# Patient Record
Sex: Female | Born: 1970 | Race: White | Hispanic: No | Marital: Married | Smoking: Current every day smoker
Health system: Southern US, Community
[De-identification: ages and names within clinical notes are randomized; demographics above are authoritative.]

## PROBLEM LIST (undated history)

## (undated) DIAGNOSIS — I10 Essential (primary) hypertension: Secondary | ICD-10-CM

## (undated) DIAGNOSIS — K5090 Crohn's disease, unspecified, without complications: Secondary | ICD-10-CM

## (undated) DIAGNOSIS — K589 Irritable bowel syndrome without diarrhea: Secondary | ICD-10-CM

## (undated) HISTORY — PX: CHOLECYSTECTOMY: SHX55

## (undated) HISTORY — PX: OTHER SURGICAL HISTORY: SHX169

---

## 1998-05-06 ENCOUNTER — Other Ambulatory Visit: Admission: RE | Admit: 1998-05-06 | Discharge: 1998-05-06 | Payer: Self-pay | Admitting: Pediatrics

## 1998-07-15 ENCOUNTER — Encounter: Payer: Self-pay | Admitting: Otolaryngology

## 1998-07-15 ENCOUNTER — Ambulatory Visit (HOSPITAL_COMMUNITY): Admission: RE | Admit: 1998-07-15 | Discharge: 1998-07-15 | Payer: Self-pay | Admitting: Otolaryngology

## 1998-08-27 ENCOUNTER — Other Ambulatory Visit: Admission: RE | Admit: 1998-08-27 | Discharge: 1998-08-27 | Payer: Self-pay | Admitting: Otolaryngology

## 1999-05-25 ENCOUNTER — Other Ambulatory Visit: Admission: RE | Admit: 1999-05-25 | Discharge: 1999-05-25 | Payer: Self-pay | Admitting: Obstetrics and Gynecology

## 2000-06-13 ENCOUNTER — Other Ambulatory Visit: Admission: RE | Admit: 2000-06-13 | Discharge: 2000-06-13 | Payer: Self-pay | Admitting: Obstetrics and Gynecology

## 2001-07-03 ENCOUNTER — Other Ambulatory Visit: Admission: RE | Admit: 2001-07-03 | Discharge: 2001-07-03 | Payer: Self-pay | Admitting: Obstetrics and Gynecology

## 2002-03-27 ENCOUNTER — Encounter: Payer: Self-pay | Admitting: Emergency Medicine

## 2002-03-27 ENCOUNTER — Emergency Department (HOSPITAL_COMMUNITY): Admission: EM | Admit: 2002-03-27 | Discharge: 2002-03-27 | Payer: Self-pay | Admitting: Emergency Medicine

## 2002-10-21 ENCOUNTER — Emergency Department (HOSPITAL_COMMUNITY): Admission: EM | Admit: 2002-10-21 | Discharge: 2002-10-21 | Payer: Self-pay | Admitting: Emergency Medicine

## 2002-10-24 ENCOUNTER — Other Ambulatory Visit: Admission: RE | Admit: 2002-10-24 | Discharge: 2002-10-24 | Payer: Self-pay | Admitting: Obstetrics and Gynecology

## 2003-11-04 ENCOUNTER — Other Ambulatory Visit: Admission: RE | Admit: 2003-11-04 | Discharge: 2003-11-04 | Payer: Self-pay | Admitting: Obstetrics and Gynecology

## 2005-11-20 ENCOUNTER — Emergency Department (HOSPITAL_COMMUNITY): Admission: EM | Admit: 2005-11-20 | Discharge: 2005-11-20 | Payer: Self-pay | Admitting: Emergency Medicine

## 2006-01-26 ENCOUNTER — Ambulatory Visit: Payer: Self-pay | Admitting: Gastroenterology

## 2006-02-11 ENCOUNTER — Ambulatory Visit: Payer: Self-pay | Admitting: Gastroenterology

## 2006-02-17 ENCOUNTER — Ambulatory Visit (HOSPITAL_COMMUNITY): Admission: RE | Admit: 2006-02-17 | Discharge: 2006-02-17 | Payer: Self-pay | Admitting: Gastroenterology

## 2006-02-18 ENCOUNTER — Inpatient Hospital Stay (HOSPITAL_COMMUNITY): Admission: EM | Admit: 2006-02-18 | Discharge: 2006-02-26 | Payer: Self-pay | Admitting: Emergency Medicine

## 2006-03-02 ENCOUNTER — Ambulatory Visit: Payer: Self-pay | Admitting: Internal Medicine

## 2006-03-09 ENCOUNTER — Ambulatory Visit (HOSPITAL_COMMUNITY): Admission: RE | Admit: 2006-03-09 | Discharge: 2006-03-09 | Payer: Self-pay | Admitting: Gastroenterology

## 2006-03-23 ENCOUNTER — Ambulatory Visit: Payer: Self-pay | Admitting: Gastroenterology

## 2006-04-26 ENCOUNTER — Ambulatory Visit: Payer: Self-pay | Admitting: Gastroenterology

## 2006-04-26 LAB — CONVERTED CEMR LAB
ALT: 19 units/L (ref 0–40)
Albumin: 3.3 g/dL — ABNORMAL LOW (ref 3.5–5.2)
Alkaline Phosphatase: 70 units/L (ref 39–117)
Basophils Relative: 0.2 % (ref 0.0–1.0)
MCHC: 34 g/dL (ref 30.0–36.0)
Monocytes Relative: 4.1 % (ref 3.0–11.0)
Platelets: 624 10*3/uL — ABNORMAL HIGH (ref 150–400)
RBC: 4.26 M/uL (ref 3.87–5.11)
RDW: 16.1 % — ABNORMAL HIGH (ref 11.5–14.6)
Total Bilirubin: 0.5 mg/dL (ref 0.3–1.2)
Total Protein: 7.1 g/dL (ref 6.0–8.3)

## 2006-07-05 ENCOUNTER — Inpatient Hospital Stay (HOSPITAL_COMMUNITY): Admission: EM | Admit: 2006-07-05 | Discharge: 2006-07-09 | Payer: Self-pay | Admitting: Emergency Medicine

## 2006-09-09 ENCOUNTER — Encounter: Admission: RE | Admit: 2006-09-09 | Discharge: 2006-09-09 | Payer: Self-pay | Admitting: Gastroenterology

## 2007-10-02 ENCOUNTER — Encounter: Admission: RE | Admit: 2007-10-02 | Discharge: 2007-10-02 | Payer: Self-pay | Admitting: Obstetrics and Gynecology

## 2008-05-05 ENCOUNTER — Emergency Department (HOSPITAL_COMMUNITY): Admission: EM | Admit: 2008-05-05 | Discharge: 2008-05-05 | Payer: Self-pay | Admitting: Emergency Medicine

## 2008-06-16 ENCOUNTER — Emergency Department (HOSPITAL_COMMUNITY): Admission: EM | Admit: 2008-06-16 | Discharge: 2008-06-16 | Payer: Self-pay | Admitting: Emergency Medicine

## 2009-04-02 ENCOUNTER — Encounter: Admission: RE | Admit: 2009-04-02 | Discharge: 2009-04-02 | Payer: Self-pay | Admitting: Gastroenterology

## 2009-05-23 ENCOUNTER — Other Ambulatory Visit: Admission: RE | Admit: 2009-05-23 | Discharge: 2009-05-23 | Payer: Self-pay | Admitting: Obstetrics and Gynecology

## 2010-04-26 ENCOUNTER — Encounter: Payer: Self-pay | Admitting: Gastroenterology

## 2010-07-20 LAB — CBC
MCHC: 33.7 g/dL (ref 30.0–36.0)
MCV: 91.7 fL (ref 78.0–100.0)
RBC: 4.96 MIL/uL (ref 3.87–5.11)
RDW: 13.5 % (ref 11.5–15.5)

## 2010-07-20 LAB — COMPREHENSIVE METABOLIC PANEL
ALT: 34 U/L (ref 0–35)
AST: 21 U/L (ref 0–37)
CO2: 29 mEq/L (ref 19–32)
Calcium: 9.3 mg/dL (ref 8.4–10.5)
Creatinine, Ser: 0.75 mg/dL (ref 0.4–1.2)
GFR calc Af Amer: >60 mL/min (ref >60)
GFR calc non Af Amer: >60 mL/min (ref >60)
Glucose, Bld: 108 mg/dL — ABNORMAL HIGH (ref 70–99)
Sodium: 140 mEq/L (ref 135–145)
Total Protein: 7 g/dL (ref 6.0–8.3)

## 2010-07-20 LAB — DIFFERENTIAL
Lymphocytes Relative: 24 % (ref 12–46)
Lymphs Abs: 2.8 10*3/uL (ref 0.7–4.0)
Monocytes Relative: 7 % (ref 3–12)
Neutrophils Relative %: 68 % (ref 43–77)

## 2010-07-20 LAB — POCT CARDIAC MARKERS
CKMB, poc: <1 ng/mL — ABNORMAL LOW (ref 1.0–8.0)
Troponin i, poc: <0.05 ng/mL (ref 0.00–0.09)

## 2010-07-20 LAB — LIPASE, BLOOD: Lipase: 29 U/L (ref 11–59)

## 2010-08-21 NOTE — Assessment & Plan Note (Signed)
[facility]                         GASTROENTEROLOGY OFFICE NOTE   NAME:JONESJeronica, Stlouis                  MRN:          [id number]  DATE:03/23/2006                            DOB:          05-31-1970    PROBLEM:  Crohn's disease.   REASON:  Ms. Jean Rosenthal is returning following her hospitalization for  small bowel obstruction.  She underwent capsule endoscopy and the  capsule became lodged in her distal ileum.  This resolved with medical  therapy including high dose steroids.  Capsule and small bowel follow  through demonstrated Crohn's disease involving the terminal ileum.  She  has been on prednisone 20 mg twice a day since that time.  She currently  feels well, having no diarrhea or abdominal pain.  Her main complaints  are related to her prednisone characterized by emotional lability.   MEDICATIONS:  Include:  1. Ranitidine.  2. Prednisone.  3. Levbid.  4. Pyridium.   EXAMINATION:  Pulse 80, blood pressure 110/70.  Weight 186.  HEENT: EOMI. PERRLA. Sclerae are anicteric.  Conjunctivae are pink.  NECK:  Supple without thyromegaly, adenopathy or carotid bruits.  CHEST:  Clear to auscultation and percussion without adventitious  sounds.  CARDIAC:  Regular rhythm; normal S1 S2.  There are no murmurs, gallops  or rubs.  ABDOMEN:  Bowel sounds are normoactive.  Abdomen is soft, non-tender and  non-distended.  There are no abdominal masses, tenderness, splenic  enlargement or hepatomegaly.  EXTREMITIES:  Full range of motion.  No cyanosis, clubbing or edema.  RECTAL:  Deferred.   IMPRESSION:  Crohn's disease involving the small bowel.   RECOMMENDATION:  1. Begin Imuran 50 mg daily.  I will check her phenotypic markers to      determine whether she metabolizes at a normal rate.  2. Prednisone taper by every 7 days.  3. Continue Xanax as needed.     Barbette Hair. Arlyce Dice, MD,FACG  Electronically Signed   RDK/MedQ  DD: 03/23/2006  DT:  03/23/2006  Job #: [id number]   cc:   Tammy R. Collins [place], M.D.

## 2010-08-21 NOTE — Assessment & Plan Note (Signed)
[facility]                                   ON-CALL NOTE   NAME:JONESShaneque, Lori [name]                          MRN:          [id number]  DATE:02/16/2006                            DOB:          [birth year]    TELEPHONE MESSAGE:  Lori [name] is a 40 year old patient of Dr. Arlyce Dice who is scheduled for  capsule endoscopy for tomorrow after having colonoscopy last week.  She was  prescribed to take a bottle of magnesium citrate 8 ounces, but has been  unable to drink it.  Her husband calls reporting that the patient has  vomited her magnesium citrate.  He asked for an alternative laxative.  This  is now around 7 o'clock in the evening, and I am calling to [facility]  MiraLax 255 gm in 64 ounces of Gatorade to mix and take one glass every 10  or 15 minutes until gone.     Hedwig Morton. Juanda Chance, MD  Electronically Signed    DMB/MedQ  DD: 02/16/2006  DT: 02/16/2006  Job #: [id number]   cc:   Barbette Hair. Arlyce Dice, MD,FACG

## 2010-08-21 NOTE — H&P (Signed)
Lori [name], Lori [name] NO.:  [id number]   MEDICAL RECORD NO.:  [id number]          PATIENT TYPE:  OBV   LOCATION:  5735                         FACILITY:  [facility]   PHYSICIAN:  Iva Boop, MD,FACGDATE OF BIRTH:  1970/06/06   DATE OF ADMISSION:  02/17/2006  DATE OF DISCHARGE:                                HISTORY & PHYSICAL   GI PHYSICIAN:  Dr. Arlyce Dice.   CHIEF COMPLAINT:  Abdominal pain following capsule endoscopy.   HISTORY OF PRESENT ILLNESS:  This is a 40 year old white female with a long  history, which she describes as at least 13 years, of diarrhea and abdominal  cramps, gastrointestinal distress, that has been diagnosed as IBS.  She  never sees blood.  She had had weight loss of up to 60 pounds 3 or 4 years  ago when she was separating from her first husband.  She is now remarried.  She denies weight fluctuations of more than 5-10 pounds within the last 18  months.  The patient underwent a CT scan of the abdomen at [facility]  [facility] showing thickening of the terminal ileum and cecal wall with  appendiceal thickening as well.  She was evaluated in the office by Dr.  Arlyce Dice on [place] and underwent colonoscopy by him on November9.  The colonoscopy was within normal limits, although he was unable to  visualize the terminal ileum.  She was set up for a capsule endoscopy and  this was performed on November 16th, which is today.  She did okay until  about 3 o'clock.  She brought the monitor back and then had some increased  symptoms of her typical pain, which tends to be bilateral in the mid lower  abdomen.  The pain became more intense and she developed shaking all over  then an episode of nausea and vomiting.  She went to the ED and Dr. Leone Payor  evaluated her.  The acute abdominal series shows some mild dilation of bowel  loops, some air fluid levels, and the capsule in the right lower quadrant.  Her last bowel movement was at 3:00 p.m.  There was some  relief after  administration of Dilaudid but the symptoms returned.  Note that the patient  required 125 mg of fentanyl and 14 mg of Versed for her colonoscopy.  She  did have amnesia but awakened readily and went out to eat after the  colonoscopy.   The patient also had incidental findings of renal, what she describes as  kidney cysts on the CT scan performed in October and she has been referred  on to a specialist for evaluation of this.  The appointment has not yet  happened.  She has a history of kidney stones but the current symptoms are  not reminiscent of kidney stone pain.  Generally, the patient's appetite is  excellent.   ALLERGIES:  No known drug allergies.   MEDICATIONS:  1. Ranitidine dose unknown p.r.n.  2. Benadryl 25 mg p.r.n. for sinus congestion and to help with sleep.  3. Vicodin 5/500 p.r.n. She does not use a lot of  this. This is for lower      back pain which she was having a few weeks back.  She tried using this      recently for today's abdominal pain but it has not been working.   SOCIAL HISTORY:  She is married twice.  She smokes about a pack a day.  Has  not drank alcohol recently but will occasionally have small amounts of  alcohol.  Does not use illicit drugs.   FAMILY HISTORY:  No GI diseases.   PAST MEDICAL HISTORY:  1. Gastroesophageal reflux disease.  2. Seasonal and environmental allergies.  3. Nephrolithiasis.  4. Renal cysts.  5. Chronic diarrhea and chronic abdominal intestinal distress diagnosed as      IBS in the past.   REVIEW OF SYSTEMS:  The patient has not had any fevers or chills but she did  have the shaking all over earlier today.  She denies urinary retention and  denies dysuria.  Previous to today, has not had any nausea or vomiting.  Recently had low back pain.  Denies rash or skin lesions.  Denies limb  weakness.  Denies headaches.  No swelling of the extremities.  No visual  changes.  No unusual bleeding or bruising.  Has  occasional trouble with  insomnia.  Is up to date with flu vaccine this year.  Has not undergone  pneumococcal vaccine in the past.  Denies shortness of breath, chest pain,  or dyspnea on exertion.  She is not on any kind of specific diet and  generally eats well.  All other systems reviewed and negative.   LABORATORY:  Hemoglobin 14.6, hematocrit 43.  Platelets 448,000.  White  blood cell count 5.6.  The MCV 84.1.  Sodium 139, potassium 3.6.  Chloride  108.  The BUN 15, creatinine 1.  Serum blood glucose at 130.  Urinalysis is  negative.  The LFTs within normal limits.   X-RAY FINDINGS:  As described above on acute abdominal series.   PHYSICAL EXAM:  The patient is an overweight, healthy-appearing white female  who is in some distress and uncomfortable.  Blood pressure 104/69, 100,  respirations 18, temperature 96.9.  Room air saturation is 97%.  HEENT EXAM:  Sclerae nonicteric. Conjunctivae pink.  Extraocular movements  intact.  Oropharynx - mucosa is moist and clear.  Dentition in good repair.  NECK:  There is no JVD, no thyromegaly, no masses.  CHEST/PULMONARY:  Lungs are clear to auscultation and percussion  bilaterally.  No cough, no shortness of breath, no dyspnea with speech.  CARDIOVASCULAR:  No murmurs, rubs or gallops. Rhythm is regular and slightly  tachycardic.  GI: The abdomen is soft, obese.  There is tenderness in the lower quadrants  bilaterally.  No rebound, no guarding.  Bowel sounds are active.  RECTAL/GU/BREAST EXAMS:  Were not performed.  PSYCHIATRIC EXAM:  The patient slightly anxious.  She is appropriate.  There  is no tremor.  Grip strength, pedal strength are full bilaterally.  She is  able to provide a good history.  DERMATOLOGIC:  No rashes, no lesions.  HEMATOLOGIC:  No significant bruising on the extremities or trunk.  MUSCULOSKELETAL:  No CVA tenderness.  No joint deformities.   IMPRESSION: 1. Abdominal pain following capsule endoscopy with x-ray  showing mildly      dilated loops of bowel and some air fluid levels.  Question whether her      symptoms are exacerbation of underlying condition which may be  irritable bowel syndrome versus inflammatory bowel disease.  Symptoms      also may be related to capsule endoscopy and we will need to follow up      with serial films in order to ascertain whether the capsule has become      obstructive in the bowel.  2. Renal cysts and history of nephrolithiasis with urinalysis negative.   PLAN:  The patient is placed in observation for supportive care including  analgesics, antiemetics, and will also undergo followup x-rays to determine  bowel gas pattern and location of capsule.      Jennye Moccasin, PA-C      Iva Boop, MD,FACG  Electronically Signed    SG/MEDQ  D:  02/18/2006  T:  02/18/2006  Job:  [id number]

## 2010-08-21 NOTE — Consult Note (Signed)
NAMEMarland Kitchen  Lori, NICOL                 ACCOUNT NO.:  [id number]   MEDICAL RECORD NO.:  [id number]          PATIENT TYPE:  INP   LOCATION:  5736                         FACILITY:  [facility]   PHYSICIAN:  Dineen Kid. Rana Snare, M.D.    DATE OF BIRTH:  Apr 20, 1970   DATE OF CONSULTATION:  07/07/2006  DATE OF DISCHARGE:                                 CONSULTATION   HISTORY OF PRESENT ILLNESS:  Lori [name] is a 40 year old white female,  well-known to me in my office and practice, who was admitted for Crohn's  ileitis.  After hospitalization, she had undergone a CAT scan as well as  pelvic ultrasound, which shows a right adnexal complex cyst that  measures 7.2 x 6.1 x 5.1 cm.  Also a left ovarian cyst that measured 3.3  x 3.2 x 2.7 cm.  They feel that it most likely represents a complex  cyst, less likely TOA or hydrosalpinx.  The patient has had a low-grade  temperature most recently this afternoon at 100.1.  She had an elevated  white count, which actually today has decreased from 27,000 down to  22,000.  She complains mostly of abdominal discomfort with right lower  quadrant discomfort, as well, and is being treated with steroids and  antibiotics at this time.  Lori [name] has seen me in the office on May 30, 2006, at which time we did do a pelvic ultrasound because of a  history of an ovarian cyst as seen by CAT scan by Dr. Isabel [name] several  months ago, at which time, on May 30, 2006, the ultrasound showed a  normal-appearing right ovary, normal-appearing uterus.  She did have a  2.8-cm cyst on the left ovary which appeared to be benign.  The patient  is monogamous, is married, has had regular cycles and is currently on  her menstrual cycle at this time, but is not using any form of  contraception at this time, because she does not have any childbearing  desires and has been unable to use birth control pills due to the fact  that she is a smoker.  She does have a history of a left ureteral stone  and is followed by Dr. Isabel [name] for this.   On physical exam today, Lori [name] appears to be in good spirits, not in  acute distress.  Her abdomen is soft, mildly to deep palpation in the  right lower quadrant and really more towards the right side out of the  pelvis.  No rebound or guarding is noted.  The patient does decline  pelvic exam due to recently having had a pelvic ultrasound and also  currently being on her menses.   IMPRESSION AND PLAN:  Crohn's ileitis; right adnexal complex cyst on  ultrasound, which is new-it was not there 1 month ago on ultrasound in  my office, and left simple-appearing ovarian cyst.  The patient's  discomfort and symptoms appear to be more consistent with the Crohn's  ileitis, while I do not exclude the possibility that this could be  related to right ovarian cystic mass.  This could be  inflammatory, due  to the Crohn's ileitis, I doubt a tubo-ovarian abscess.  I discussed  earlier with Dr. Randa [name] the possibility of going ahead and placing her  empirically on Cipro and Flagyl, which he has begun.  She is currently  on a steroid for the ileitis.  I discussed different options with Lori [name].  She agrees that she thinks that this is most likely the Crohn's disease.  I discussed the possibility of laparoscopic evaluation, but I would  prefer to manage this medically, if at all possible.  She does not  appear to be acutely ill or have anything suspicious for ovarian  torsion.  I did recommend ovulation suppression for long-term management  of a recurrent ovarian cyst and also her menorrhagia and dysmenorrhea.  Because she is a smoker, we cannot use birth control pills, so I did go  ahead and start her on Depo-Provera 150 mg IM every 3 months, which we  can go ahead and start today.  If we are able to get her through this  acute episode in the hospital, we will have her follow with my  office in 4-6 weeks for pelvic ultrasound and followup.  If the pain  does not  improve, or certainly, if we feel like it is more likely a tubo-  ovarian abscess, please contact me for further evaluation and I could  offer laparoscopic evaluation, but I feel like it is not necessary at  this time.      Dineen Kid Rana Snare, M.D.  Electronically Signed     DCL/MEDQ  D:  07/07/2006  T:  07/08/2006  Job:  [id number]

## 2010-08-21 NOTE — Letter (Signed)
January 26, 2006    Lori [name]  [address]  [place], [place]  [address]   RE:  Lori [name], Lori [name]  MRN:  [id number]  /  DOB:  [birth year]   Dear Ms. Jean Rosenthal:   It is my pleasure to have treated you recently as a new patient in my  office.  I appreciate your confidence and the opportunity to participate in  your care.   Since I do have a busy inpatient endoscopy schedule and office schedule, my  office hours vary weekly.  I am, however, available for emergency calls  every day through my office.  If I cannot promptly meet an urgent office  appointment, another one of our gastroenterologists will be able to assist  you.   My well-trained staff are prepared to help you at all times.  For  emergencies after office hours, a physician from our gastroenterology  section is always available through my 24-hour answering service.   While you are under my care, I encourage discussion of your questions and  concerns, and I will be happy to return your calls as soon as I am  available.   Once again, I welcome you as a new patient and I look forward to a happy and  healthy relationship.   Sincerely,     Barbette Hair. Arlyce Dice, MD,FACG    RDK/MedQ  DD: 01/26/2006  DT: 01/27/2006  Job #: [id number]

## 2010-08-21 NOTE — H&P (Signed)
NAMEMarland [name]  Lori, [name] NO.:  [id number]   MEDICAL RECORD NO.:  [id number]          PATIENT TYPE:  INP   LOCATION:  5733                         FACILITY:  [facility]   PHYSICIAN:  Bernette Redbird, M.D.   DATE OF BIRTH:  1971/01/16   DATE OF ADMISSION:  07/05/2006  DATE OF DISCHARGE:                              HISTORY & PHYSICAL   This 40 year old female with Crohn's disease is being admitted to the  hospital because of significant abdominal pain.   Lori [name] was diagnosed with Crohn's ileitis last November by Dr. Barnet Pall  who had previously treated her, but she switched from him to Dr. Carman Ching after a problem developed during a small bowel capsule endoscopy  procedure, wherein the pill became impacted in the intestine, and she  had to be admitted to the hospital for about 8 days on IV steroids  before it finally passed.  Dr. Randa Evens has followed her for the past 2  months or so and was in the process of obtaining an updated small bowel  series when the patient began to have increased abdominal pain over the  past 24 hours, primarily in the low abdomen, not associated with any  significant nausea or vomiting or fevers.  The pain was somewhat  bloating in character.  It seemed to occur after eating a sub sandwich.  She tried some Vicodin at home which may have relieved the pain  partially but really could not control it adequately, so she called back  and we felt it was best for her to come to the emergency room.  She has  had some slight nausea after taking Vicodin and has had temperatures at  home as high as 99.9 degrees but no frank high fevers.  In view of all  this, she was directed to the emergency room where she was noted to be  in moderate discomfort and inpatient care of her symptom control and  diagnostic clarification was felt to be warranted.   PAST MEDICAL HISTORY:   ALLERGIES:  SULFA DRUGS (nausea).   OUTPATIENT MEDICATIONS:  1. Imuran 150  q.a.m.  2. Levbid 1 tablet b.i.d.  3. Xanax p.r.n.  4. Vicodin p.r.n.  5. Pyridium 200 mg b.i.d.  6. Ranitidine at bedtime.  7. AcipHex in the morning.  8. Pentasa 500 mg q.i.d.  (The patient has been on a steroid taper and has been off steroids  entirely for approximately the past 2 months.)   OPERATIONS:  1. Cholecystectomy.  2. Removal of pilonidal cyst.  3. Tonsillectomy.  4. Ear surgery.   MEDICAL ILLNESSES:  1. Chronic reflux which is fairly well controlled by her anti-reflux      regimen with a PPI in the morning and ranitidine in the evening.  2. Crohn's disease.  3. Dysuria followed by Dr. Elana Alm.  (No cardiopulmonary disease, diabetes or hypertension.)   HABITS:  She does smoke but is a nondrinker.   FAMILY HISTORY:  Negative for Crohn's disease.  Positive for colon  polyps in her father and IBS in a sister.   SOCIAL  HISTORY:  Married with two children by a previous marriage,  recently remarried.  She has worked as a Architectural technologist.   REVIEW OF SYSTEMS:  GERD symptoms, well controlled by medication,  chronic abdominal discomfort, even prior to the current flare up.  No  dysphagia to my knowledge.  No weight loss (on the contrary, gained a  lot of weight while on steroids).  No rectal bleeding.  No  extraintestinal manifestations of Crohn's such as oral ulcerations, skin  rashes, arthralgias or ocular complaints.   PHYSICAL EXAMINATION:  GENERAL:  Natali is a somewhat overweight,  somewhat cushingoid-appearing, pleasant, cooperative Caucasian female  who is intermittently in moderate distress due to abdominal pain.  VITAL SIGNS:  Temperature 99.4, blood pressure 114/77, pulse 109,  respirations 26 and unlabored.  HEENT:  Oropharynx benign without oral ulcers appreciated.  Anicteric.  No frank pallor.  CHEST:  Clear to auscultation.  HEART:  Normal except for slightly rapid rate.  ABDOMEN:  Obese, but overall benign.  Bowel sounds are present and   really are normal in character, nonobstructive.  No bruits.  No  significant tympany.  The abdomen is soft and without guarding, mass  effect, organomegaly, rigidity or rebound.  There is some rather diffuse  subjective tenderness to deep palpation, but this is not impressive.  Overall, the abdominal exam is benign.  RECTAL:  No evident perianal disease, no evident draining fistulae.  Normal sphincter tone.  No rectal masses and soft brown stool which is  Hemoccult negative as performed by the ER staff.   LABORATORY DATA:  As of this time, we do have the patient's hemogram  back which shows elevated white count of 15,900, hemoglobin 13.2,  platelets 434,000 with 87 polys, 6 lymphocytes, 6 monocytes.  Other  tests such as urine pregnancy test and CMET are pending.   IMPRESSION:  I think the overall picture is most compatible with partial  distal small-bowel obstruction which could be secondary to an activation  of her Crohn's ileitis and/or some degree of food impaction.   PLAN:  Tonight, the emesis will be on symptom control and hydration.  In  the morning, the patient will be reassessed by Dr. Randa Evens who could  order plain films (presuming the pregnancy test comes back negative),  and/or a small bowel series to further define the current status of the  luminal caliber of the small intestine in the region of the terminal  ileum, or a CT scan of the abdomen and pelvis to help exclude  extraintestinal disease such as intra-abdominal abscesses (doubt).          ______________________________  Bernette Redbird, M.D.    RB/MEDQ  D:  07/05/2006  T:  07/05/2006  Job:  [id number]   cc:   Tammy R. Collins [place], M.D.

## 2010-08-21 NOTE — Assessment & Plan Note (Signed)
[facility]                                 ON-CALL NOTE   NAME:JONESPreslynn, [name]                          MRN:          [id number]  DATE:04/29/2006                            DOB:          [birth year]    [name] is a patient of Dr. Marzetta Board, on steroids for Crohn's disease.  She has difficulty sleeping and has been using alprazolam at bedtime.  I  have confirmed this through checking DrFirst.  She has run out of her  medication, she received 25 approximately 1 month ago.  I have refilled  this at 0.5 mg one to two at bedtime as needed #30 with no refills.     Iva Boop, MD,FACG  Electronically Signed    CEG/MedQ  DD: 04/29/2006  DT: 04/30/2006  Job #: [id number]   cc:   Barbette Hair. Arlyce Dice, MD,FACG

## 2010-08-21 NOTE — Assessment & Plan Note (Signed)
[facility]                                 ON-CALL NOTE   NAME:JONESAllana, Shrestha                          MRN:          [id number]  DATE:05/01/2006                            DOB:          Sep 28, 1970    Time of 3:15.   Mr. and Mrs. Sallie called the answering service, and I returned their  call.  She had been having problems with her legs jerking and twitching  and bothering her, and feeling fidgety, as well as in her arms.  She has  been on Imuran for about a month.  There is no rash or breathing  problems.  She took some Xanax to try to sleep, but that did not help.  She also had taken 2 Benadryl because of a runny nose.  She is concerned  that this is a medication reaction.  I told her that was possible, that  it certainly was not a typical reaction for Imuran.  I advised her to  hold the Imuran and discuss this with Dr. Arlyce Dice in the next couple of  days, as he is her regular gastroenterologist.  She has had this problem  for 2, maybe 3 nights.  She is to go to the emergency department if  things worsen.     Iva Boop, MD,FACG  Electronically Signed    CEG/MedQ  DD: 05/01/2006  DT: 05/01/2006  Job #: [id number]   cc:   Barbette Hair. Arlyce Dice, MD,FACG

## 2010-08-21 NOTE — Discharge Summary (Signed)
Lori [name], ROLFSON NO.:  [id number]   MEDICAL RECORD NO.:  [id number]          PATIENT TYPE:  INP   LOCATION:  5709                         FACILITY:  [facility]   PHYSICIAN:  Rachael Fee, MD   DATE OF BIRTH:  [birth year]   DATE OF ADMISSION:  02/18/2006  DATE OF DISCHARGE:  02/26/2006                               DISCHARGE SUMMARY   ADMITTING DIAGNOSES:  1. Abdominal pain following capsule endoscopy with mild dilatation of      bowel loops and air-fluid levels raising a question of small bowel      obstruction in a patient who underwent capsule endoscopy on      February 17, 2006.  Rule out small-bowel obstruction, rule out      symptoms exacerbation of previous diagnosis of irritable bowel      syndrome.  Rule out inflammatory bowel disease with stricturing and      retained capsule.  2. Renal cysts.  3. History of nephrolithiasis.  4. Gastroesophageal reflux disease.  5. Seasonal and environmental allergies.  6. Chronic diarrhea and chronic intestinal distress diagnosed as      irritable bowel syndrome in the past.  Question if the patient now      has inflammatory bowel disease, specifically Crohn's involving the      terminal ileum.  7. CT scan of January 26, 2006 did show thickening of the terminal      ileum and cecal, as well as appendiceal, thickening.  Status post      colonoscopy on February 11, 2006.  The study was normal, but Dr.      Arlyce Dice was unable to visualize the terminal ileum.  8. Status post cholecystectomy.   DISCHARGE DIAGNOSES:  1. New diagnosis of Crohn's disease at the terminal ileum.  2. Partial small bowel obstruction with retention of the endoscopic      capsule at the terminal ileum.  Eventually, the capsule passed.  3. Mild normocytic anemia with hemoglobin nadir of 10.6.  She did not      require blood cell product transfusion.  4. Steroid-associated hyperglycemia with serum glucose ranging from      108 to 143.   CONSULTATIONS:  Dr. Luretha Murphy from [facility].   BRIEF HISTORY:  Lori [name] is a 40 year old female with a long, at least  13-year, history of diarrhea, abdominal cramps and generalized GI  distress.  In the past, this has been attributed to irritable bowel  syndrome, but she had never undergone colonoscopic evaluation.  Three or  four years ago, she had a 60-pound weight loss, but that was when she  was separating from her first husband.  Since then, she has remarried,  and she has had weight fluctuations of 5-10 pounds within the last year  and a half.  A CT scan at [facility] in late October showed  thickening of the terminal ileum, appendiceal and cecal regions.  Dr.  Arlyce Dice performed a colonoscopy on February 11, 2006.  Visualized colon  was normal, but he was unable to intubate into the terminal ileum.  She  underwent capsule endoscopy on November 16, the day of admission.  She  was feeling fine, brought the monitor back, and about 3 o'clock she had  recurrence of her typical pain, albeit at a more intense level.  The  pain was in the mid lower abdomen and radiating into both right and left  pelvic lower abdominal regions.  She developed shaking all over and had  an episode of nausea and vomiting.  She came to the emergency room, and  the x-ray showed mild dilation of the small bowel, some air fluid  levels, and the capsule was sitting in the right lower quadrant.  Dr.  Leone Payor evaluated her and admitted her for further care.  Of course,  there was concern that there was a bowel obstruction, and that the  capsule had become lodged in the terminal ileum.   LABORATORIES:  Hemoglobin nadir of 10.6;  it was 11.4 at discharge.  MCV  82.6.  Platelets 408,000.  White blood cell count ranged 5.6 to 13.2.  MCV 84.1.  Sodium 138, potassium nadir of 3.0; 3.8 at discharge.  Serum  glucose ranged 108 to 130.  BUN 15, creatinine 0.9.  Calcium 8.7.  Total  bilirubin 0.6,  alkaline phosphatase 71, AST 20, ALT 14.  Urine pregnancy  negative.  Urinalysis negative.  Clostridium difficile toxin negative.   IMAGING STUDIES:  Acute abdominal series with chest of February 17, 2006  showed a possible mild or early small bowel obstruction and a capsule  within the right lower quadrant.  Two-view abdomen films on February 18, 2006 showed findings worrisome for worsening small bowel obstruction,  also showing bibasilar airspace disease.  Films from February 19, 2006  showed increased distension of loops of small bowel consistent with  small bowel obstruction and stable position of the endoscopic capsule.  Films from February 20, 2006 show mild decrease in the small bowel  dilatation, but persistent stable position of the capsule in the capsule  camera in the right colon.  On February 21, 2006, the small bowel was  partially decompressed following placement of an NG tube.  Some air  entered into the colon, but finding still suspicious for partial small  bowel obstruction.  By February 23, 2006, the camera capsule was no  longer visible, but partial small bowel obstruction persisted.  Films of  February 23, 2006 and February 24, 2006 still show some element of  partial small bowel obstruction.  Films of February 25, 2006 show the  capsule camera to have migrated to the ascending colon/hepatic flexure  junction.  By the films of February 26, 2006, the capsule camera was no  longer visible, and the bowel gas pattern was nonspecific with mention  of a single dilated loop of jejunum.   HOSPITAL COURSE:  1. Retained capsule camera and partial small bowel obstruction.  The      patient had persistent requirements for IV pain management.  The      capsule endoscopy was read on February 17, 2006 by Dr. Leone Payor and      did confirm Crohn's disease of the ileum with associated stenosis      and hold up of the capsule from passing through this region.  The     patient was  finally convinced to allow staff to place an NG tube      for decompression on February 20, 2006.  Output within the first 12      hours from this  was 1.4 liters.  PCA Dilaudid was provided for pain      control.  The patient was started on IV steroids once the diagnosis      of Crohn's disease was made.  Over the next several days, she had      daily x-rays which continued to show partial, if not complete,      small bowel obstruction and retention of the capsule.  Ultimately,      the bowel obstruction resolved, the capsule passed into the colon,      and then was no longer visible.  By this time, the patient was      feeling better.  The NG tube was discontinued, and she had no      resulting nausea or vomiting.  Diet was advanced from clears to a      low-residue diet.  She did complain of some lower abdominal cramps.      She was ultimately discharged to home in stable and improved      condition on prednisone 20 mg twice daily, Levbid twice daily and      Levsin along with Vicodin as needed.  The office was to call her      with the time and date of an appointment with Dr. Arlyce Dice in early      December.  At that point, a small bowel follow through would be      arranged.  She was not started on any medication other than the      steroids to treat the Crohn's disease at this point.  2. Hyperglycemia.  The patient's blood sugars did shoot up into the      low to mid 100s range associated with the IV Solu-Medrol.  She did      not require use of any sliding-scale insulin.   MEDICATIONS GIVEN:  1. Prednisone 20 mg twice daily.  2. Levbid 0.375 mg twice daily.  3. Levsin 0.125 mg p.r.n. q.3-4h.  4. Vicodin one to two p.o. q.4-6h. as needed with the admonishment to      limit use of the Vicodin to pain that was not responding to Levsin.   FOLLOWUP:  Return office visit with Dr. Arlyce Dice in early December.  Office would call with the arrangements for that.      Jennye Moccasin,  PA-C      Rachael Fee, MD  Electronically Signed    SG/MEDQ  D:  04/06/2006  T:  04/06/2006  Job:  [id number]   cc:   Barbette Hair. Arlyce Dice, MD,FACG  Tammy R. Collins Scotland, M.D.

## 2010-08-21 NOTE — Discharge Summary (Signed)
NAMEMarland [name]  Lori [name] NO.:  [id number]   MEDICAL RECORD NO.:  [id number]          PATIENT TYPE:  EMS   LOCATION:  ED                           FACILITY:  [facility]   PHYSICIAN:  James L. Malon Kindle., M.D.DATE OF BIRTH:  [birth year]   DATE OF ADMISSION:  09/25/2006  DATE OF DISCHARGE:  09/25/2006                               DISCHARGE SUMMARY   ADMISSION DIAGNOSIS:  Partial small bowel obstruction secondary to  Crohn's disease.   DISCHARGE DIAGNOSIS:  Partial small bowel obstruction secondary to  Crohn's disease.   PERTINENT HISTORY:  The patient has known Crohn's disease, and had a  partial small bowel obstruction.  She had a history of Givens capsule  being obstructed in the small bowel.  She had significant abdominal pain  and this occurred after eating a submarine sandwich.  She had low-grade  temperature and came to the emergency room with this history.   PHYSICAL EXAMINATION:  Remarkable for fairly-benign abdominal  examination.   Stool was Hemoccult-negative by the ER staff.   The patient had reported films showing partial small bowel obstruction  but I cannot currently find those film reports in the chart.   HOSPITAL COURSE:  The patient was admitted to medical floor and placed  on IV antibiotics, and IV fluids.  CT enterography was obtained, which  showed a pelvic inflammatory process involving the colon, left adnexa.  They could not be clear if this was related to Crohn's disease.  There  was clear terminal ileitis consistent with Crohn's and a separate  inflammatory process in the pelvis.  Transabdominal, transpelvic and  transvaginal pelvic ultrasounds were obtained.  This showed complex cyst  in both ovaries, without a clear inflammatory process.  The patient was  seen in consultation by Dr. Candice Camp, her gynecologist. It was felt  that she had Crohn's as a cause of her symptoms, and the cysts were  incidental.  He would follow them up.  The  patient was on Cipro and  Flagyl, started on clear liquids.  She clinically improved to the point  that she was able to take oral medications.  Her diet was advanced.  On  April 4, she was feeling much better, and was felt to be in satisfactory  condition for discharge.   DISPOSITION:  The patient is discharged home on Imuran 150 mg daily,  Pentasa 500 mg 4 tablets 4 times daily, Cipro 500 mg b.i.d., Flagyl 500  mg b.i.d., Librax t.i.d., and Vicodin ES.  She is to follow up with Dr.  Candice Camp in 2-3 weeks, and will make an appointment.  Follow up with  Dr. Randa Evens in the next several days at previously-arranged appointment.           ______________________________  [name]. Malon Kindle., M.D.     Waldron Session  D:  09/29/2006  T:  09/30/2006  Job:  [id number]   cc:   Dineen Kid. Rana Snare, M.D.  Tammy R. Collins Scotland, M.D.

## 2010-08-21 NOTE — Letter (Signed)
January 26, 2006     Tammy R. Collins Scotland, M.D.  [address], [place] [address]   RE:  Lori [name], Lori [name]  MRN:  [id number]  /  DOB:  1970-06-13   Dear Dr. Collins Scotland:   Upon your kind referral, I had the pleasure of evaluating your patient and I  am pleased to offer my findings.  I saw [name] in the office today.  Enclosed is a copy of my progress note that details my findings and  recommendations.   Thank you for the opportunity to participate in your patient's care.    Sincerely,      Barbette Hair. Arlyce Dice, MD,FACG    RDK/MedQ  DD: 01/26/2006  DT: 01/27/2006  Job #: [id number]

## 2010-08-21 NOTE — Assessment & Plan Note (Signed)
[facility]                                 ON-CALL NOTE   NAME:JONESPetronella, Shuford                  MRN:          [id number]  DATE:03/06/2006                            DOB:          1970/07/31    Ms. Kelso is a lady with Crohn's disease of the ileum. I know her from  an interaction in the hospital previously. She was started on prednisone  40 mg daily and Levbid. She had a retained capsule in her small bowel  which passed. Her abdominal symptoms are better but since starting the  prednisone, she has had frequent headaches without any focal neurologic  findings from the history I can take here. She has no fever. She is not  having terrible nausea or vomiting. The headaches are helped by Vicodin  and she was wondering if there was something else that could be tried  that was might be stronger than the Vicodin. She has a followup  appointment with Dr. Arlyce Dice in 2 days.   I instructed her to reduce her prednisone from 40 to 30 mg a day in  hopes that would reduce these side effects and to continue the Vicodin.  If things change or worsen, she is to call back but otherwise keep her  followup with Dr. Arlyce Dice.     Iva Boop, MD,FACG  Electronically Signed    CEG/MedQ  DD: 03/06/2006  DT: 03/07/2006  Job #: [id number]   cc:   Barbette Hair. Arlyce Dice, MD,FACG

## 2010-08-21 NOTE — Op Note (Signed)
Lori [name], Lori [name]                 ACCOUNT NO.:  [id number]   MEDICAL RECORD NO.:  [id number]          PATIENT TYPE:  INP   LOCATION:  5736                         FACILITY:  [facility]   PHYSICIAN:  James L. Malon Kindle., M.D.DATE OF BIRTH:  Nov 15, 1970   DATE OF PROCEDURE:  07/07/2006  DATE OF DISCHARGE:                               OPERATIVE REPORT   PROCEDURE:  Colonoscopy.   MEDICATIONS:  Fentanyl 100 mcg, Versed 10 mg IV.   INDICATIONS:  Crohn's, patient came in with pain.  CT showed Crohn's in  the TI but there abnormalities in the ovaries and possible Crohn's of  the sigmoid colon.  This is done to evaluate the sigmoid colon for  active Crohn's and possible fistula.   DESCRIPTION OF PROCEDURE:  Procedure had been explained to the patient  and consent obtained.  In left lateral decubitus position, the pediatric  Pentax scope was inserted and advanced, the prep was somewhat marginal.  There was sticky adherent stool throughout.  We went through the sigmoid  colon and saw no signs whatsoever of active Crohn's.  We were able to  advance over to the cecum.  This was documented.  The ileocecal valve  was seen but I could not enter easily and due to the discomfort I did  not try further.  The scope was withdrawn.  The cecum, ascending,  transverse, descending colon had no active Crohn's.  Several passes were  made through the sigmoid.  This is vigorously irrigated.  There was no  signs of active Crohn disease sigmoid or rectum.  Scope withdrawn.  The  patient tolerated the procedure well.   ASSESSMENT:  1. Abnormal CT with no active Crohn's.  Would wonder if this is due to      pelvic infection.   PLAN:  Will give the patient IV antibiotics, continue on liquids, will  obtain a GYN consult with Dr. Rana [name]           ______________________________  Lori [name]. Malon Kindle., M.D.     Waldron Session  D:  07/07/2006  T:  07/07/2006  Job:  [id number]   cc:   Lori [name], M.D.  Lori [name],  M.D.

## 2010-08-21 NOTE — Op Note (Signed)
Lori [name], TRANI NO.:  [id number]   MEDICAL RECORD NO.:  [id number]          PATIENT TYPE:  INP   LOCATION:  5709                         FACILITY:  [facility]   PHYSICIAN:  Iva Boop, MD,FACGDATE OF BIRTH:  [birth year]   DATE OF PROCEDURE:  02/17/2006  DATE OF DISCHARGE:                                 OPERATIVE REPORT   PROCEDURE:  Small bowel capsule endoscopy.   SURGEON:  Iva Boop, MD,FACG.   INDICATIONS:  Chronic abdominal pain and some diarrhea.  Terminal ileal  thickening on CT with cecal/appendiceal thickening as well.  A recent  colonoscopy to the cecum was normal; the terminal ileum was not entered.   PROCEDURE DATA:  Height 64 inches, 194 pounds, stocky build.  The gastric  passage time was 34 minutes.   FINDINGS:  The capsule did not reach the colon during the study.  Inflammatory changes of erythema, ulceration and edema, as well as  microhemorrhage and some blood in the ileum were seen.  Stenosis in the  distal ileum without focal stricture.  But I think the capsule was moving to  and fro in the region of the stenosis, and could be a stricture.  The last  image may actually be more proximal after the capsule bounced off the  stenosis and possible stricture.   SUMMARY:  These findings are consistent with Crohn disease of the ileum  causing stenosis and holdup of capsule passage, at least.  She has had pain  and small bowel dilation on x-ray within hours of the procedure, and she is  in the hospital now.   RECOMMENDATIONS:  Surgical consultation, IV steroids, and follow with x-  rays.      Iva Boop, MD,FACG  Electronically Signed     CEG/MEDQ  D:  02/18/2006  T:  02/19/2006  Job:  [id number]

## 2010-08-21 NOTE — Assessment & Plan Note (Signed)
[facility]                                 ON-CALL NOTE   NAME:[name], [name]                  MRN:          [id number]  DATE:03/30/2006                            DOB:          13-May-1970    PHONE NUMBER:  [phone].   CALLER:  The patient's husband, Lori [name].   TIME OF CALL:  5:39 p.m.   Mr. Manwarren called this evening regarding his wife Lori [name]. He asked if I  could call in Vicodin. She had some cramping pain and only has 1 Vicodin  left. I told him that we cannot prescribe narcotics over the telephone  off hours in patients with which we are not familiar. I did recommend  that he contact the office during regular hours and consult Dr. Arlyce Dice  regarding this issue. She did not feel any different than her baseline.  However, I told him if she needs urgent attention between now and office  hours to proceed to the hospital emergency room.     Wilhemina Bonito. Marina Goodell, MD  Electronically Signed    JNP/MedQ  DD: 03/30/2006  DT: 03/30/2006  Job #: [id number]   cc:   Barbette Hair. Arlyce Dice, MD,FACG

## 2010-08-21 NOTE — Assessment & Plan Note (Signed)
[facility]                                   ON-CALL NOTE   MAVERICK, PATMAN                      MRN:          [id number]  DATE:02/17/2006                            DOB:          January 23, 1971    The patient's husband called the answering service, and I responded at  approximately 6:30 p.m. on February 17, 2006.  She is having exacerbation of  chronic abdominal pain for which she has undergone a workup with a  colonoscopy (apparently unrevealing).  Originally, a CT scan showed some  terminal ileum inflammation.  The pain is bilateral in the lower quadrants  and similar as before, though maybe a bit more severe.  She had a capsule  endoscopy test to the small bowel today, and she seemed to tolerate that  okay, though the pain is a little more intense.  There is no vomiting or  fever.  She apparently has not received any medications for her pain.   I told her husband that at this point I could not really prescribe anything  based upon my knowledge of the situation.  It will be important to follow up  with her testing, and will discuss with nursing staff in the morning.  Will  get her chart and see if Dr. Arlyce Dice or myself could prescribed something  based upon all the available data.   If she gets severe problems overnight, she is go to the emergency room.     Iva Boop, MD,FACG  Electronically Signed    CEG/MedQ  DD: 02/17/2006  DT: 02/17/2006  Job #: [id number]   cc:   Barbette Hair. Arlyce Dice, MD,FACG

## 2010-08-21 NOTE — Assessment & Plan Note (Signed)
[facility]                           GASTROENTEROLOGY OFFICE NOTE   Lori [name], Lori [name]                      MRN:          [id number]  DATE:01/26/2006                            DOB:          1970-11-21    REASON FOR CONSULTATION:  Abdominal pain and abnormal CT.   [name] is a pleasant 40 year old white female referred through the  courtesy of Dr. Yehuda Budd for evaluation. Over the past year, she has been  complaining of abdominal bloating, fullness, and frequent diarrhea. She  typically has 3 to 5 bowel movements a day. There is no history of melena or  hematochezia. She complains of diffuse upper and mid abdominal pain as well.  Pain is occasionally sharp. She underwent a CT scan at [facility]  that demonstrated thickening of the terminal ileum and the cecal wall.  Appendix appeared thickened as well. No abscess was seen. [name] has  lost 60 pounds in the last 2 years although she attributes that to stress.   PAST MEDICAL HISTORY:  Pertinent for kidney stones. She is status post  cholecystectomy, pilonidal cyst removal.   FAMILY HISTORY:  Noncontributory.   MEDICATIONS:  Ranitidine and Benadryl.   She smokes. She does not drink. 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   REVIEW OF SYSTEMS:  Positive for pyrosis which is well controlled with  ranitidine. She has a frequent cough, back pain and complains of fatigue.   PHYSICAL EXAMINATION:  Pulse 64, blood pressure 120/80. Weight 192.  HEENT: EOMI. PERRLA. Sclerae are anicteric.  Conjunctivae are pink.  NECK:  Supple without thyromegaly, adenopathy or carotid bruits.  CHEST:  Clear to auscultation and percussion without adventitious sounds.  CARDIAC:  Regular rhythm; normal S1 S2.  There are no murmurs, gallops or  rubs.  ABDOMEN:  She has mild right lower quadrant tenderness without guarding or  rebound. There are no abdominal masses or organomegaly.  EXTREMITIES:  Full range of motion.  No cyanosis, clubbing or edema.  RECTAL:  Deferred.   Lab work is pertinent for normal CMET.   IMPRESSION:  High probability of Crohn's disease involving the terminal  ileum and cecum. It is unlikely that she has appendicitis in view of the  longevity of her symptoms.   RECOMMENDATION:  Colonoscopy.       Barbette Hair. Arlyce Dice, MD,FACG      RDK/MedQ  DD:  01/26/2006  DT:  01/27/2006  Job #:  [id number]   cc:   Tammy R. Collins Scotland, M.D.

## 2012-05-28 ENCOUNTER — Encounter (HOSPITAL_BASED_OUTPATIENT_CLINIC_OR_DEPARTMENT_OTHER): Payer: Self-pay | Admitting: Emergency Medicine

## 2012-05-28 ENCOUNTER — Emergency Department (HOSPITAL_BASED_OUTPATIENT_CLINIC_OR_DEPARTMENT_OTHER): Payer: BC Managed Care – PPO

## 2012-05-28 ENCOUNTER — Emergency Department (HOSPITAL_BASED_OUTPATIENT_CLINIC_OR_DEPARTMENT_OTHER)
Admission: EM | Admit: 2012-05-28 | Discharge: 2012-05-28 | Disposition: A | Discharge status: Home / Self Care | Payer: BC Managed Care – PPO | Attending: Emergency Medicine | Admitting: Emergency Medicine

## 2012-05-28 DIAGNOSIS — J40 Bronchitis, not specified as acute or chronic: Secondary | ICD-10-CM | POA: Insufficient documentation

## 2012-05-28 DIAGNOSIS — R059 Cough, unspecified: Secondary | ICD-10-CM | POA: Insufficient documentation

## 2012-05-28 DIAGNOSIS — Z8719 Personal history of other diseases of the digestive system: Secondary | ICD-10-CM | POA: Insufficient documentation

## 2012-05-28 DIAGNOSIS — Z79899 Other long term (current) drug therapy: Secondary | ICD-10-CM | POA: Insufficient documentation

## 2012-05-28 DIAGNOSIS — I10 Essential (primary) hypertension: Secondary | ICD-10-CM | POA: Insufficient documentation

## 2012-05-28 DIAGNOSIS — Z87891 Personal history of nicotine dependence: Secondary | ICD-10-CM | POA: Insufficient documentation

## 2012-05-28 DIAGNOSIS — R05 Cough: Secondary | ICD-10-CM | POA: Insufficient documentation

## 2012-05-28 DIAGNOSIS — J3489 Other specified disorders of nose and nasal sinuses: Secondary | ICD-10-CM | POA: Insufficient documentation

## 2012-05-28 HISTORY — DX: Essential (primary) hypertension: I10

## 2012-05-28 HISTORY — DX: Crohn's disease, unspecified, without complications: K50.90

## 2012-05-28 HISTORY — DX: Irritable bowel syndrome, unspecified: K58.9

## 2012-05-28 MED ORDER — ALBUTEROL SULFATE (5 MG/ML) 0.5% IN NEBU
5.0000 mg | INHALATION_SOLUTION | Freq: Once | RESPIRATORY_TRACT | Status: AC
  Administered 2012-05-28: 5 mg via RESPIRATORY_TRACT
  Filled 2012-05-28: qty 1

## 2012-05-28 MED ORDER — DEXAMETHASONE 4 MG PO TABS
10.0000 mg | ORAL_TABLET | Freq: Once | ORAL | Status: AC
  Administered 2012-05-28: 10 mg via ORAL
  Filled 2012-05-28: qty 1

## 2012-05-28 MED ORDER — ALBUTEROL SULFATE HFA 108 (90 BASE) MCG/ACT IN AERS: 2.0000 | INHALATION_SPRAY | RESPIRATORY_TRACT | Status: DC | PRN

## 2012-05-28 MED ORDER — ALBUTEROL SULFATE HFA 108 (90 BASE) MCG/ACT IN AERS
INHALATION_SPRAY | RESPIRATORY_TRACT | Status: AC
  Administered 2012-05-28: 2 via RESPIRATORY_TRACT
  Filled 2012-05-28: qty 6.7

## 2012-05-28 NOTE — ED Notes (Signed)
Pt reports shortness of breath last night, gradually getting worse throughout the night/this am. Pt reports productive cough x 2 weeks. Saw MD Monday, "dx bronchitis".

## 2012-05-28 NOTE — ED Notes (Signed)
Sarah, RTT at bedside.

## 2012-05-28 NOTE — ED Notes (Signed)
MD at bedside. 

## 2012-05-28 NOTE — ED Notes (Signed)
Pt reports took Mucinex Extra Strength yesterday approx 1300. Has been taking Mucinex for past week for nasal congestion/cough symptoms.

## 2012-05-28 NOTE — ED Provider Notes (Signed)
History     CSN: [id number]  Arrival date & time 05/28/12  0148   First MD Initiated Contact with Patient 05/28/12 0214      Chief Complaint  Patient presents with  . Shortness of Breath    (Consider location/radiation/quality/duration/timing/severity/associated sxs/prior treatment) HPI This is a 42 year old female with about a week and a half history of cough. It has been intermittently productive but has not been associated with fever. She was seen by her primary care physician several days ago, was diagnosed with bronchitis, and prescribed an albuterol inhaler which she did not get filled. Over the past 24 hour she has develop shortness of breath which she describes as feeling like she cannot take a deep breath. She is having difficulty coughing up phlegm whereas previously she had been able to cough it up. Symptoms are moderate. She denies nausea or vomiting. She has chronic diarrhea due to her Crohn's disease. She is having some chest soreness.  Past Medical History  Diagnosis Date  . Hypertension   . Crohn's disease   . IBS (irritable bowel syndrome)     Past Surgical History  Procedure Laterality Date  . Cholecystectomy    . Mastoid surgery      No family history on file.  History  Substance Use Topics  . Smoking status: Former Games developer  . Smokeless tobacco: Not on file  . Alcohol Use: No    OB History   Grav Para Term Preterm Abortions TAB SAB Ect Mult Living                  Review of Systems  All other systems reviewed and are negative.    Allergies  Ciprofloxacin and Codeine  Home Medications   Current Outpatient Rx  Name  Route  Sig  Dispense  Refill  . guaiFENesin (MUCINEX) 600 MG 12 hr tablet   Oral   Take 1,200 mg by mouth.         Marland Kitchen lisinopril (PRINIVIL,ZESTRIL) 20 MG tablet   Oral   Take 20 mg by mouth daily.         Marland Kitchen omeprazole-sodium bicarbonate (ZEGERID) 40-1100 MG per capsule   Oral   Take 1 capsule by mouth daily before  breakfast.           BP 147/90  Pulse 99  Temp(Src) 97.8 F (36.6 C) (Oral)  SpO2 100%  LMP 05/18/2012  Physical Exam General: Well-developed, well-nourished female in no acute distress; appearance consistent with age of record HENT: normocephalic, atraumatic Eyes: pupils equal round and reactive to light; extraocular muscles intact Neck: supple Heart: regular rate and rhythm Lungs: Decreased air movement without frank wheezing Abdomen: soft; nondistended Extremities: No deformity; full range of motion; pulses normal Neurologic: Awake, alert and oriented; motor function intact in all extremities and symmetric; no facial droop Skin: Warm and dry Psychiatric: Normal mood and affect    ED Course  Procedures (including critical care time)     MDM  Nursing notes and vitals signs, including pulse oximetry, reviewed.  Summary of this visit's results, reviewed by myself:   Imaging Studies: Dg Chest 2 View  05/28/2012  *RADIOLOGY REPORT*  Clinical Data: Shortness of breath last night.  Worsening. Productive cough for 2 weeks.  CHEST - 2 VIEW  Comparison: 05/05/2008  Findings: The heart size and pulmonary vascularity are normal. The lungs appear clear and expanded without focal air space disease or consolidation. No blunting of the costophrenic angles.  No pneumothorax.  Mediastinal contours appear intact.  Degenerative changes in the thoracic spine.  Lingular changes seen previously have resolved.  IMPRESSION: No evidence of active pulmonary disease.   Original Report Authenticated By: Burman Nieves, M.D.    2:46 AM Air movement improved after albuterol neb treatment. Patient reports subjective improvement. We will provide an inhaler and give a dose of steroids. Antibiotics are not indicated at this time.        Hanley Seamen, MD 05/28/12 [phone]

## 2012-08-22 ENCOUNTER — Encounter: Payer: Self-pay | Admitting: Cardiology

## 2012-08-25 ENCOUNTER — Encounter: Payer: Self-pay | Admitting: Cardiovascular Disease

## 2012-08-25 ENCOUNTER — Ambulatory Visit: Payer: BC Managed Care – PPO | Admitting: Cardiovascular Disease

## 2017-07-31 ENCOUNTER — Emergency Department (HOSPITAL_BASED_OUTPATIENT_CLINIC_OR_DEPARTMENT_OTHER): Payer: Self-pay

## 2017-07-31 ENCOUNTER — Other Ambulatory Visit: Payer: Self-pay

## 2017-07-31 ENCOUNTER — Emergency Department (HOSPITAL_BASED_OUTPATIENT_CLINIC_OR_DEPARTMENT_OTHER)
Admission: EM | Admit: 2017-07-31 | Discharge: 2017-07-31 | Disposition: A | Discharge status: Home / Self Care | Payer: Self-pay | Attending: Emergency Medicine | Admitting: Emergency Medicine

## 2017-07-31 ENCOUNTER — Encounter (HOSPITAL_BASED_OUTPATIENT_CLINIC_OR_DEPARTMENT_OTHER): Payer: Self-pay | Admitting: Emergency Medicine

## 2017-07-31 DIAGNOSIS — Y929 Unspecified place or not applicable: Secondary | ICD-10-CM | POA: Insufficient documentation

## 2017-07-31 DIAGNOSIS — Z87891 Personal history of nicotine dependence: Secondary | ICD-10-CM | POA: Insufficient documentation

## 2017-07-31 DIAGNOSIS — Z79899 Other long term (current) drug therapy: Secondary | ICD-10-CM | POA: Insufficient documentation

## 2017-07-31 DIAGNOSIS — I10 Essential (primary) hypertension: Secondary | ICD-10-CM | POA: Insufficient documentation

## 2017-07-31 DIAGNOSIS — W0110XA Fall on same level from slipping, tripping and stumbling with subsequent striking against unspecified object, initial encounter: Secondary | ICD-10-CM | POA: Insufficient documentation

## 2017-07-31 DIAGNOSIS — S43004A Unspecified dislocation of right shoulder joint, initial encounter: Secondary | ICD-10-CM | POA: Insufficient documentation

## 2017-07-31 DIAGNOSIS — Y999 Unspecified external cause status: Secondary | ICD-10-CM | POA: Insufficient documentation

## 2017-07-31 DIAGNOSIS — Y9301 Activity, walking, marching and hiking: Secondary | ICD-10-CM | POA: Insufficient documentation

## 2017-07-31 MED ORDER — PROMETHAZINE HCL 25 MG PO TABS: 25.0000 mg | ORAL_TABLET | Freq: Four times a day (QID) | ORAL | 0 refills | Status: DC | PRN

## 2017-07-31 MED ORDER — HYDROMORPHONE HCL 1 MG/ML IJ SOLN
1.0000 mg | Freq: Once | INTRAMUSCULAR | Status: AC
  Administered 2017-07-31: 1 mg via INTRAVENOUS
  Filled 2017-07-31: qty 1

## 2017-07-31 MED ORDER — MORPHINE SULFATE (PF) 4 MG/ML IV SOLN
4.0000 mg | Freq: Once | INTRAVENOUS | Status: AC
  Administered 2017-07-31: 4 mg via INTRAVENOUS
  Filled 2017-07-31: qty 1

## 2017-07-31 MED ORDER — ETOMIDATE 2 MG/ML IV SOLN
10.0000 mg | Freq: Once | INTRAVENOUS | Status: AC
  Administered 2017-07-31: 10 mg via INTRAVENOUS
  Filled 2017-07-31: qty 10

## 2017-07-31 MED ORDER — DIAZEPAM 5 MG PO TABS: 5.0000 mg | ORAL_TABLET | Freq: Two times a day (BID) | ORAL | 0 refills | Status: AC

## 2017-07-31 MED ORDER — MIDAZOLAM HCL 2 MG/2ML IJ SOLN
2.0000 mg | Freq: Once | INTRAMUSCULAR | Status: AC
  Administered 2017-07-31: 2 mg via INTRAVENOUS
  Filled 2017-07-31: qty 2

## 2017-07-31 MED ORDER — OXYCODONE-ACETAMINOPHEN 5-325 MG PO TABS: 2.0000 | ORAL_TABLET | ORAL | 0 refills | Status: AC | PRN

## 2017-07-31 MED ORDER — ONDANSETRON HCL 4 MG/2ML IJ SOLN
4.0000 mg | Freq: Once | INTRAMUSCULAR | Status: AC
  Administered 2017-07-31: 4 mg via INTRAVENOUS
  Filled 2017-07-31: qty 2

## 2017-07-31 NOTE — ED Triage Notes (Signed)
Patient states that she fell at home at 730 this AM  - the patient has a deformity to her right shoulder

## 2017-07-31 NOTE — ED Provider Notes (Signed)
[facility] EMERGENCY DEPARTMENT Provider Note   CSN: [id number] Arrival date & time: 07/31/17  1478     History   Chief Complaint Chief Complaint  Patient presents with  . Shoulder Injury    HPI Lori [name] is a 47 y.o. female.  Pt presents to the ED today with severe right shoulder pain s/p fall.  She feels like it is dislocated.  She denies any other injury.  The pt did not hit her head or have a loc.     Past Medical History:  Diagnosis Date  . Crohn's disease (HCC)   . Hypertension   . IBS (irritable bowel syndrome)   . IBS (irritable bowel syndrome)     There are no active problems to display for this patient.   Past Surgical History:  Procedure Laterality Date  . CHOLECYSTECTOMY    . mastoid surgery       OB History   None      Home Medications    Prior to Admission medications   Medication Sig Start Date End Date Taking? Authorizing Provider  diazepam (VALIUM) 5 MG tablet Take 1 tablet (5 mg total) by mouth 2 (two) times daily. 07/31/17   Jacalyn Lefevre, MD  guaiFENesin (MUCINEX) 600 MG 12 hr tablet Take 1,200 mg by mouth.    [provider]  lisinopril (PRINIVIL,ZESTRIL) 20 MG tablet Take 20 mg by mouth daily.    [provider]  omeprazole-sodium bicarbonate (ZEGERID) 40-1100 MG per capsule Take 1 capsule by mouth daily before breakfast.    [provider]  oxyCODONE-acetaminophen (PERCOCET/ROXICET) 5-325 MG tablet Take 2 tablets by mouth every 4 (four) hours as needed for severe pain. 07/31/17   Jacalyn Lefevre, MD  promethazine (PHENERGAN) 25 MG tablet Take 1 tablet (25 mg total) by mouth every 6 (six) hours as needed for nausea or vomiting. 07/31/17   Jacalyn Lefevre, MD    Family History History reviewed. No pertinent family history.  Social History Social History   Tobacco Use  . Smoking status: Former Games developer  . Smokeless tobacco: Never Used  Substance Use Topics  . Alcohol use: No  . Drug use:  No     Allergies   Ciprofloxacin and Codeine   Review of Systems Review of Systems  Musculoskeletal:       Right shoulder  All other systems reviewed and are negative.    Physical Exam Updated Vital Signs BP 103/60   Pulse 76   Temp 98.2 F (36.8 C) (Oral)   Resp 15   Ht  (1.626 m)   Wt 108.9 kg (240 lb)   LMP 05/18/2012   SpO2 92%   BMI 41.20 kg/m   Physical Exam  Constitutional: She appears well-developed and well-nourished.  HENT:  Head: Normocephalic and atraumatic.  Right Ear: External ear normal.  Left Ear: External ear normal.  Nose: Nose normal.  Mouth/Throat: Oropharynx is clear and moist.  Eyes: Pupils are equal, round, and reactive to light. Conjunctivae and EOM are normal.  Neck: Normal range of motion. Neck supple.  Cardiovascular: Normal rate, regular rhythm, normal heart sounds and intact distal pulses.  Pulmonary/Chest: Effort normal and breath sounds normal.  Abdominal: Soft. Bowel sounds are normal.  Musculoskeletal:       Right shoulder: She exhibits decreased range of motion, tenderness and deformity.  Neurological: She is alert.  Skin: Skin is warm and dry. Capillary refill takes less than 2 seconds.  Psychiatric: She has a normal  mood and affect. Her behavior is normal. Judgment and thought content normal.  Nursing note and vitals reviewed.    ED Treatments / Results  Labs (all labs ordered are listed, but only abnormal results are displayed) Labs Reviewed - No data to display  EKG None  Radiology Dg Shoulder Right  Result Date: 07/31/2017 CLINICAL DATA:  Post reduction. EXAM: RIGHT SHOULDER - 2+ VIEW COMPARISON:  Earlier today. FINDINGS: Normal alignment of the glenohumeral joint post reduction of anterior shoulder dislocation. Subtle focal cortical regularity over the greater tuberosity without evidence of fracture. Remainder the exam is unchanged. IMPRESSION: Normal alignment at the glenohumeral joint post reduction of  anterior shoulder dislocation. No fracture. Electronically Signed   By: Elberta Fortis M.D.   On: 07/31/2017 14:25   Dg Shoulder Right Portable  Result Date: 07/31/2017 CLINICAL DATA:  Post reduction film. EXAM: PORTABLE RIGHT SHOULDER COMPARISON:  Earlier same day. FINDINGS: Reduction of patient's previous anterior shoulder dislocation with normal alignment at the glenohumeral joint. Subtle focal cortical irregularity over the greater tuberosity as cannot exclude incomplete fracture. IMPRESSION: Normal alignment at the glenohumeral joint post reduction. Focal cortical regularity over the greater tuberosity which could represent focal nondisplaced fracture. Consider complete right shoulder series for better evaluation. Electronically Signed   By: Elberta Fortis M.D.   On: 07/31/2017 13:08   Dg Shoulder Right Portable  Result Date: 07/31/2017 CLINICAL DATA:  Larey Seat, injury. EXAM: PORTABLE RIGHT SHOULDER COMPARISON:  None. FINDINGS: Single AP view demonstrates apparent anterior inferior shoulder dislocation. No definite fracture. IMPRESSION: Apparent anterior inferior shoulder dislocation without definite fracture. Electronically Signed   By: Elsie Stain M.D.   On: 07/31/2017 11:10    Procedures .Sedation Date/Time: 07/31/2017 1:46 PM Performed by: Jacalyn Lefevre, MD Authorized by: Jacalyn Lefevre, MD   Consent:    Consent obtained:  Written   Consent given by:  Patient   Risks discussed:  Inadequate sedation, nausea, vomiting and respiratory compromise necessitating ventilatory assistance and intubation   Alternatives discussed:  Analgesia without sedation Universal protocol:    Immediately prior to procedure a time out was called: yes     Patient identity confirmation method:  Verbally with patient Indications:    Procedure performed:  Dislocation reduction   Procedure necessitating sedation performed by:  Physician performing sedation   Intended level of sedation:  Moderate (conscious  sedation) Pre-sedation assessment:    Time since last food or drink:  5   ASA classification: class 2 - patient with mild systemic disease     Neck mobility: normal     Mouth opening:  2 finger widths   Mallampati score:  II - soft palate, uvula, fauces visible   Pre-sedation assessments completed and reviewed: airway patency, cardiovascular function, hydration status, mental status, nausea/vomiting, pain level, respiratory function and temperature   Immediate pre-procedure details:    Reassessment: Patient reassessed immediately prior to procedure     Reviewed: vital signs, relevant labs/tests and NPO status     Verified: bag valve mask available, emergency equipment available, intubation equipment available, IV patency confirmed, oxygen available and reversal medications available   Procedure details (see MAR for exact dosages):    Preoxygenation:  Room air   Sedation:  Etomidate and midazolam   Analgesia:  Hydromorphone and morphine   Intra-procedure monitoring:  Blood pressure monitoring, cardiac monitor, continuous capnometry, continuous pulse oximetry, frequent LOC assessments and frequent vital sign checks   Intra-procedure events: hypoxia     Intra-procedure management:  Airway repositioning  and supplemental oxygen   Total Provider sedation time (minutes):  30 Post-procedure details:    Post-sedation assessment completed:  07/31/2017 1:49 PM   Attendance: Constant attendance by certified staff until patient recovered     Recovery: Patient returned to pre-procedure baseline     Post-sedation assessments completed and reviewed: airway patency, cardiovascular function, hydration status, mental status, nausea/vomiting, pain level, respiratory function and temperature     Patient is stable for discharge or admission: yes     Patient tolerance:  Tolerated well, no immediate complications Comments:     Pt's oxygen saturation dropped during procedure briefly.  I suspect due to body habitus  that pt has a degree of sleep apnea which contributed to this occurring.  Airway repositioned and supplemental oxygen applied.  Pt's O2 immediately went back up. Reduction of dislocation Date/Time: 07/31/2017 1:50 PM Performed by: Jacalyn Lefevre, MD Authorized by: Jacalyn Lefevre, MD  Consent: Written consent obtained. Risks and benefits: risks, benefits and alternatives were discussed Consent given by: patient Patient understanding: patient states understanding of the procedure being performed Patient consent: the patient's understanding of the procedure matches consent given Procedure consent: procedure consent matches procedure scheduled Relevant documents: relevant documents present and verified Test results: test results available and properly labeled Site marked: the operative site was marked Imaging studies: imaging studies available Required items: required blood products, implants, devices, and special equipment available Patient identity confirmed: verbally with patient Time out: Immediately prior to procedure a "time out" was called to verify the correct patient, procedure, equipment, support staff and site/side marked as required. Preparation: Patient was prepped and draped in the usual sterile fashion. Local anesthesia used: no  Anesthesia: Local anesthesia used: no  Sedation: Patient sedated: no  Patient tolerance: Patient tolerated the procedure well with no immediate complications Comments: Please see nurse's notes for sedation times.   Pt was sedated.  See sedation note.    (including critical care time)    Medications Ordered in ED Medications  morphine 4 MG/ML injection 4 mg (4 mg Intravenous Given 07/31/17 1010)  ondansetron (ZOFRAN) injection 4 mg (4 mg Intravenous Given 07/31/17 1010)  HYDROmorphone (DILAUDID) injection 1 mg (1 mg Intravenous Given 07/31/17 1028)  etomidate (AMIDATE) injection 10 mg (10 mg Intravenous Given 07/31/17 1225)  midazolam  (VERSED) injection 2 mg (2 mg Intravenous Given 07/31/17 1223)  HYDROmorphone (DILAUDID) injection 1 mg (1 mg Intravenous Given 07/31/17 1257)     Initial Impression / Assessment and Plan / ED Course  I have reviewed the triage vital signs and the nursing notes.  Pertinent labs & imaging results that were available during my care of the patient were reviewed by me and considered in my medical decision making (see chart for details).     Pt is awake and alert.  She is ambulating and tolerating po fluids.  She is instructed to f/u with ortho and to return if worse.  Final Clinical Impressions(s) / ED Diagnoses   Final diagnoses:  Shoulder dislocation, right, initial encounter    ED Discharge Orders        Ordered    oxyCODONE-acetaminophen (PERCOCET/ROXICET) 5-325 MG tablet  Every 4 hours PRN     07/31/17 1432    diazepam (VALIUM) 5 MG tablet  2 times daily     07/31/17 1432    promethazine (PHENERGAN) 25 MG tablet  Every 6 hours PRN     07/31/17 1441       Jacalyn Lefevre, MD 07/31/17 1442

## 2017-07-31 NOTE — ED Notes (Signed)
Ice chips given and patient tolerated well.

## 2017-07-31 NOTE — ED Notes (Signed)
Pt being transported home with mother and friend.

## 2017-07-31 NOTE — ED Notes (Signed)
NS 1L bolus IV given in left hand per verbal order from DR. HAVILAND

## 2017-07-31 NOTE — ED Notes (Signed)
NS 1L IV Bolus given in left hand IV per verbal order from Dr. Particia Nearing.

## 2017-07-31 NOTE — ED Provider Notes (Signed)
Pharmacist at [facility] contacted me.  Patient prescribed excessive oxycodone.  Prescription changed by me to Percocet 1 tablet every 6 hours as needed for pain   Doug Sou, MD 07/31/17 1657

## 2017-08-10 ENCOUNTER — Other Ambulatory Visit (INDEPENDENT_AMBULATORY_CARE_PROVIDER_SITE_OTHER): Payer: Self-pay | Admitting: Surgery

## 2017-08-10 ENCOUNTER — Ambulatory Visit (INDEPENDENT_AMBULATORY_CARE_PROVIDER_SITE_OTHER): Payer: Self-pay | Admitting: Surgery

## 2017-08-10 ENCOUNTER — Encounter (INDEPENDENT_AMBULATORY_CARE_PROVIDER_SITE_OTHER): Payer: Self-pay | Admitting: Surgery

## 2017-08-10 DIAGNOSIS — M25511 Pain in right shoulder: Secondary | ICD-10-CM

## 2017-08-10 DIAGNOSIS — S43006A Unspecified dislocation of unspecified shoulder joint, initial encounter: Secondary | ICD-10-CM

## 2017-08-10 DIAGNOSIS — S43491A Other sprain of right shoulder joint, initial encounter: Secondary | ICD-10-CM

## 2017-08-10 MED ORDER — HYDROCODONE-ACETAMINOPHEN 5-325 MG PO TABS: 1.0000 | ORAL_TABLET | Freq: Three times a day (TID) | ORAL | 0 refills | Status: AC | PRN

## 2017-08-10 MED ORDER — PROMETHAZINE HCL 25 MG PO TABS: 25.0000 mg | ORAL_TABLET | Freq: Four times a day (QID) | ORAL | 0 refills | Status: DC | PRN

## 2017-08-10 NOTE — Progress Notes (Signed)
   Office Visit Note   Patient: Lori [name]           Date of Birth: Jul 24, 1970           MRN: [id number] Visit Date: 08/10/2017              Requested by: No referring provider defined for this encounter. PCP: System, Pcp Not In   Assessment & Plan: Visit Diagnoses:  1. Dislocation of shoulder joint   2. Acute pain of right shoulder     Plan: Patient will follow-up with with Dr. August Saucer in 1 week to review right shoulder MRI and discuss treatment options.  Follow-Up Instructions: Return in about 1 week (around 08/17/2017) for Follow-up with Dr. August Saucer to review MRI right shoulder.   Orders:  Orders Placed This Encounter  Procedures   MR SHOULDER RIGHT W CONTRAST   Meds ordered this encounter  Medications   HYDROcodone-acetaminophen (NORCO/VICODIN) 5-325 MG tablet    Sig: Take 1 tablet by mouth every 8 (eight) hours as needed for moderate pain.    Dispense:  30 tablet    Refill:  0      Procedures: No procedures performed   Clinical Data: No additional findings.   Subjective: Chief Complaint  Patient presents with   Right Shoulder - Follow-up, Pain    HPI  Review of Systems   Objective: Vital Signs: BP 126/77 (BP Location: Left Arm, Patient Position: Sitting, Cuff Size: Large)   Pulse 90   Ht  (1.626 m)   Wt 240 lb (108.9 kg)   LMP 05/18/2012   BMI 41.20 kg/m   Physical Exam  Ortho Exam  Specialty Comments:  No specialty comments available.  Imaging: No results found.   PMFS History: There are no active problems to display for this patient.  Past Medical History:  Diagnosis Date   Crohn's disease (HCC)    Hypertension    IBS (irritable bowel syndrome)    IBS (irritable bowel syndrome)     History reviewed. No pertinent family history.  Past Surgical History:  Procedure Laterality Date   CHOLECYSTECTOMY     mastoid surgery     Social History   Occupational History   Not on file  Tobacco Use   Smoking status: Current Every  Day Smoker   Smokeless tobacco: Current User  Substance and Sexual Activity   Alcohol use: Yes    Comment: social   Drug use: No   Sexual activity: Yes    Birth control/protection: None

## 2017-08-22 ENCOUNTER — Ambulatory Visit (INDEPENDENT_AMBULATORY_CARE_PROVIDER_SITE_OTHER): Payer: Self-pay | Admitting: Orthopedic Surgery

## 2017-08-31 ENCOUNTER — Ambulatory Visit (INDEPENDENT_AMBULATORY_CARE_PROVIDER_SITE_OTHER): Payer: Self-pay | Admitting: Orthopedic Surgery

## 2017-08-31 ENCOUNTER — Ambulatory Visit (INDEPENDENT_AMBULATORY_CARE_PROVIDER_SITE_OTHER): Payer: Self-pay

## 2017-08-31 ENCOUNTER — Encounter (INDEPENDENT_AMBULATORY_CARE_PROVIDER_SITE_OTHER): Payer: Self-pay | Admitting: Orthopedic Surgery

## 2017-08-31 DIAGNOSIS — M25511 Pain in right shoulder: Secondary | ICD-10-CM

## 2017-08-31 MED ORDER — HYDROCODONE-ACETAMINOPHEN 5-325 MG PO TABS: ORAL_TABLET | ORAL | 0 refills | Status: DC

## 2017-09-04 ENCOUNTER — Encounter (INDEPENDENT_AMBULATORY_CARE_PROVIDER_SITE_OTHER): Payer: Self-pay | Admitting: Orthopedic Surgery

## 2017-09-04 NOTE — Progress Notes (Signed)
   Office Visit Note   Patient: Lori GibsonKelly L [name]           Date of Birth: 06/08/1970           MRN: [id number] Visit Date: 08/31/2017 Requested by: No referring provider defined for this encounter. PCP: System, Pcp Not In  Subjective: Chief Complaint  Patient presents with  . Right Shoulder - Follow-up    HPI: Lori [name] is a patient with right shoulder pain following fall 07/31/2017.  Since she was last seen she has had MRI scan which shows subacute mildly displaced fracture of the greater tuberosity with no rotator cuff tear.  That scan is not available for review.  She is been in a sling.  Taking Norco for pain.              ROS: All systems reviewed are negative as they relate to the chief complaint within the history of present illness.  Patient denies  fevers or chills.   Assessment & Plan: Visit Diagnoses:  1. Right shoulder pain, unspecified chronicity     Plan: Impression is right greater tuberosity fracture with no change in displacement and no rotator cuff tear.  Plan is sling for 2 more weeks with initiation of physical therapy and pendulum exercises at that time.  I will see her back in 3 weeks with repeat AP shoulder in internal rotation external rotation  Follow-Up Instructions: Return in about 3 weeks (around 09/21/2017).   Orders:  Orders Placed This Encounter  Procedures  . XR Shoulder Right   Meds ordered this encounter  Medications  . HYDROcodone-acetaminophen (NORCO/VICODIN) 5-325 MG tablet    Sig: 1 PO Q 8-12HRS PRN PAIN    Dispense:  30 tablet    Refill:  0      Procedures: No procedures performed   Clinical Data: No additional findings.  Objective: Vital Signs: LMP 05/18/2012   Physical Exam:   Constitutional: Patient appears well-developed HEENT:  Head: Normocephalic Eyes:EOM are normal Neck: Normal range of motion Cardiovascular: Normal rate Pulmonary/chest: Effort normal Neurologic: Patient is alert Skin: Skin is warm Psychiatric:  Patient has normal mood and affect    Ortho Exam: Orthopedic exam demonstrates full active and passive range of motion of the left shoulder.  On the right-hand side no coarse grinding or crepitus with passive range of motion of the shoulder.  Deltoid is functional.  No ecchymosis or bruising is present.  Specialty Comments:  No specialty comments available.  Imaging: No results found.   PMFS History: There are no active problems to display for this patient.  Past Medical History:  Diagnosis Date  . Crohn's disease (HCC)   . Hypertension   . IBS (irritable bowel syndrome)   . IBS (irritable bowel syndrome)     History reviewed. No pertinent family history.  Past Surgical History:  Procedure Laterality Date  . CHOLECYSTECTOMY    . mastoid surgery     Social History   Occupational History  . Not on file  Tobacco Use  . Smoking status: Current Every Day Smoker  . Smokeless tobacco: Current User  Substance and Sexual Activity  . Alcohol use: Yes    Comment: social  . Drug use: No  . Sexual activity: Yes    Birth control/protection: None

## 2017-09-21 ENCOUNTER — Ambulatory Visit (INDEPENDENT_AMBULATORY_CARE_PROVIDER_SITE_OTHER): Payer: Self-pay | Admitting: Orthopedic Surgery

## 2017-09-21 ENCOUNTER — Ambulatory Visit (INDEPENDENT_AMBULATORY_CARE_PROVIDER_SITE_OTHER): Payer: Self-pay

## 2017-09-21 ENCOUNTER — Encounter (INDEPENDENT_AMBULATORY_CARE_PROVIDER_SITE_OTHER): Payer: Self-pay | Admitting: Orthopedic Surgery

## 2017-09-21 DIAGNOSIS — M25511 Pain in right shoulder: Secondary | ICD-10-CM

## 2017-09-21 DIAGNOSIS — S43006A Unspecified dislocation of unspecified shoulder joint, initial encounter: Secondary | ICD-10-CM

## 2017-09-21 MED ORDER — PROMETHAZINE HCL 25 MG PO TABS: ORAL_TABLET | ORAL | 0 refills | Status: AC

## 2017-09-21 MED ORDER — HYDROCODONE-ACETAMINOPHEN 5-325 MG PO TABS: ORAL_TABLET | ORAL | 0 refills | Status: AC

## 2017-09-24 NOTE — Progress Notes (Signed)
   Office Visit Note   Patient: Lori GibsonKelly L [name]           Date of Birth: [birth year]           MRN: [id number] Visit Date: 09/21/2017 Requested by: No referring provider defined for this encounter. PCP: System, Pcp Not In  Subjective: Chief Complaint  Patient presents with  . Right Shoulder - Follow-up    HPI: Lori EndoKelly presents for follow-up of right shoulder dislocation.  Date of injury 07/31/2017.  States her shoulder is still sore.  She has been in therapy for a week.  They did add 3 exercises Monday.              ROS: All systems reviewed are negative as they relate to the chief complaint within the history of present illness.  Patient denies  fevers or chills.   Assessment & Plan: Visit Diagnoses:  1. Dislocation of shoulder joint   2. Right shoulder pain, unspecified chronicity     Plan: Impression is stable right shoulder with no real change in alignment of the greater tuberosity fracture.  Plan is refill hydrocodone and Phenergan x1.  Continue with physical therapy.  This could take another month or 2 to really become functional.  I want her to be careful with lifting away from her body.  Follow-up in 6 weeks for clinical recheck and repeat radiographs.  Follow-Up Instructions: Return in about 6 weeks (around 11/02/2017).   Orders:  Orders Placed This Encounter  Procedures  . XR Shoulder Right   Meds ordered this encounter  Medications  . HYDROcodone-acetaminophen (NORCO/VICODIN) 5-325 MG tablet    Sig: 1 PO BID PRN PAIN    Dispense:  35 tablet    Refill:  0  . promethazine (PHENERGAN) 25 MG tablet    Sig: 1 PO Q 12 HRS PRN NAUSEA    Dispense:  30 tablet    Refill:  0      Procedures: No procedures performed   Clinical Data: No additional findings.  Objective: Vital Signs: LMP 05/18/2012   Physical Exam:   Constitutional: Patient appears well-developed HEENT:  Head: Normocephalic Eyes:EOM are normal Neck: Normal range of motion Cardiovascular: Normal  rate Pulmonary/chest: Effort normal Neurologic: Patient is alert Skin: Skin is warm Psychiatric: Patient has normal mood and affect    Ortho Exam: Ortho exam demonstrates not much in the way of crepitus with passive range of motion of that right shoulder.  Deltoid fires.  Rotator cuff strength is improving.  Passive range of motion is also good.  Specialty Comments:  No specialty comments available.  Imaging: No results found.   PMFS History: There are no active problems to display for this patient.  Past Medical History:  Diagnosis Date  . Crohn's disease (HCC)   . Hypertension   . IBS (irritable bowel syndrome)   . IBS (irritable bowel syndrome)     History reviewed. No pertinent family history.  Past Surgical History:  Procedure Laterality Date  . CHOLECYSTECTOMY    . mastoid surgery     Social History   Occupational History  . Not on file  Tobacco Use  . Smoking status: Current Every Day Smoker  . Smokeless tobacco: Current User  Substance and Sexual Activity  . Alcohol use: Yes    Comment: social  . Drug use: No  . Sexual activity: Yes    Birth control/protection: None

## 2017-11-02 ENCOUNTER — Encounter (INDEPENDENT_AMBULATORY_CARE_PROVIDER_SITE_OTHER): Payer: Self-pay | Admitting: Orthopedic Surgery

## 2017-11-02 ENCOUNTER — Ambulatory Visit (INDEPENDENT_AMBULATORY_CARE_PROVIDER_SITE_OTHER): Payer: Self-pay

## 2017-11-02 ENCOUNTER — Ambulatory Visit (INDEPENDENT_AMBULATORY_CARE_PROVIDER_SITE_OTHER): Payer: Self-pay | Admitting: Orthopedic Surgery

## 2017-11-02 DIAGNOSIS — S43004D Unspecified dislocation of right shoulder joint, subsequent encounter: Secondary | ICD-10-CM

## 2017-11-02 DIAGNOSIS — S43006A Unspecified dislocation of unspecified shoulder joint, initial encounter: Secondary | ICD-10-CM

## 2017-11-02 NOTE — Progress Notes (Signed)
Office Visit Note   Patient: Lori [name]           Date of Birth: 08-31-1970           MRN: [id number] Visit Date: 11/02/2017 Requested by: No referring provider defined for this encounter. PCP: System, Pcp Not In  Subjective: Chief Complaint  Patient presents with  . Right Shoulder - Follow-up    HPI: Patient presents for follow-up of right shoulder dislocation and nondisplaced tuberosity injury.  Date of injury 07/31/2017.  She is having a little bit of popping during therapy but overall she is doing better.  She denies any episodes of instability.  Reports some difficulty reaching behind her and across her body.              ROS: All systems reviewed are negative as they relate to the chief complaint within the history of present illness.  Patient denies  fevers or chills.   Assessment & Plan: Visit Diagnoses:  1. Dislocation of shoulder joint     Plan: Impression is good progress following right shoulder dislocation with no evidence of recurrent instability.  She has very good rotator cuff strength.  The amount of mechanical symptoms she is having is pretty minimal.  I meant to release her at this time I will see her back as needed  Follow-Up Instructions: No follow-ups on file.   Orders:  Orders Placed This Encounter  Procedures  . XR Shoulder Right   No orders of the defined types were placed in this encounter.     Procedures: No procedures performed   Clinical Data: No additional findings.  Objective: Vital Signs: LMP 05/18/2012   Physical Exam:   Constitutional: Patient appears well-developed HEENT:  Head: Normocephalic Eyes:EOM are normal Neck: Normal range of motion Cardiovascular: Normal rate Pulmonary/chest: Effort normal Neurologic: Patient is alert Skin: Skin is warm Psychiatric: Patient has normal mood and affect    Ortho Exam: Ortho exam demonstrates full active and passive range of motion of the left shoulder.  On the right she is  lacking about 10 degrees of full forward flexion.  No other masses lymphadenopathy or skin changes noted in that shoulder girdle region.  She has excellent rotator cuff strength to infraspinatus supra spinatus subscap muscle testing.  No other masses lymph adenopathy or skin changes noted in that shoulder girdle region.  I do not detect much coarseness or grinding with passive range of motion.  Negative apprehension relocation testing today.  Specialty Comments:  No specialty comments available.  Imaging: Xr Shoulder Right  Result Date: 11/02/2017 AP outlet lateral right shoulder reviewed.  Greater tuberosity fracture nondisplaced has healed with out any further displacement.  Shoulder is located.  Visualized lung fields clear.  No other radiographic abnormalities present.    PMFS History: There are no active problems to display for this patient.  Past Medical History:  Diagnosis Date  . Crohn's disease (HCC)   . Hypertension   . IBS (irritable bowel syndrome)   . IBS (irritable bowel syndrome)     History reviewed. No pertinent family history.  Past Surgical History:  Procedure Laterality Date  . CHOLECYSTECTOMY    . mastoid surgery     Social History   Occupational History  . Not on file  Tobacco Use  . Smoking status: Current Every Day Smoker  . Smokeless tobacco: Current User  Substance and Sexual Activity  . Alcohol use: Yes    Comment: social  . Drug use: No  .  Sexual activity: Yes    Birth control/protection: None

## 2020-01-29 IMAGING — DX DG SHOULDER 2+V PORT*R*
1 series · 1 of 1 positions shown · non-contrast
Comparison: None.

CLINICAL DATA: Fell, injury.

EXAM:
PORTABLE RIGHT SHOULDER

[shoulder grashey]
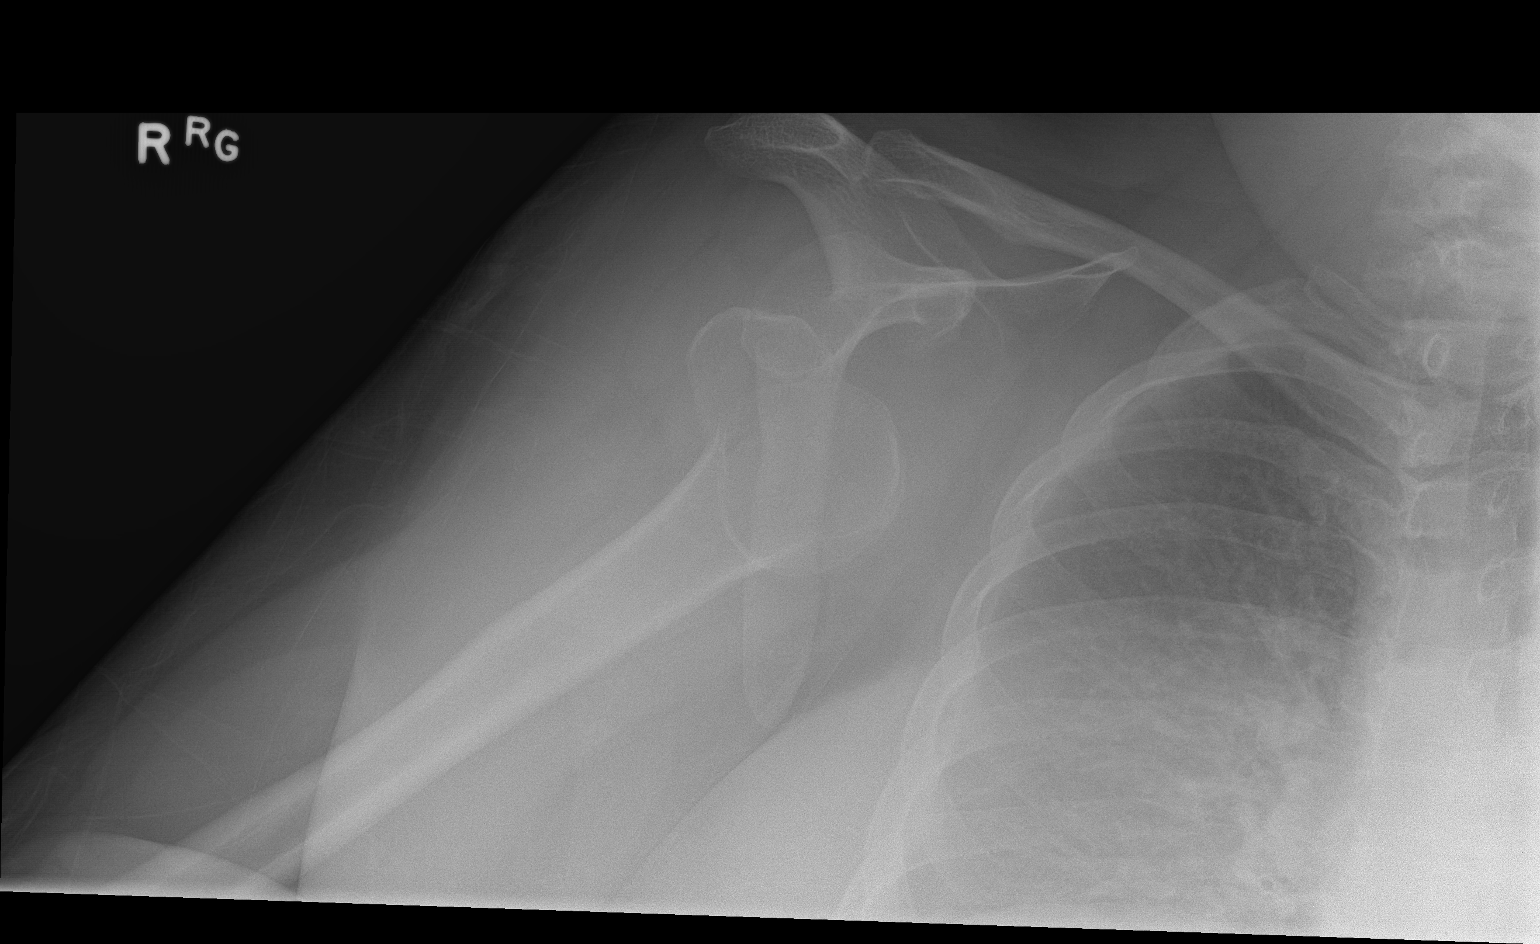

[1 of 1 positions shown; findings below may reference images not displayed]

FINDINGS: Single AP view demonstrates apparent anterior inferior shoulder
dislocation. No definite fracture.
IMPRESSION: Apparent anterior inferior shoulder dislocation without definite
fracture.

## 2020-01-29 IMAGING — DX DG SHOULDER 2+V*R*
2 series · 2 of 2 positions shown · non-contrast
Comparison: Earlier today.

CLINICAL DATA: Post reduction.

EXAM:
RIGHT SHOULDER - 2+ VIEW

[shoulder grashey]
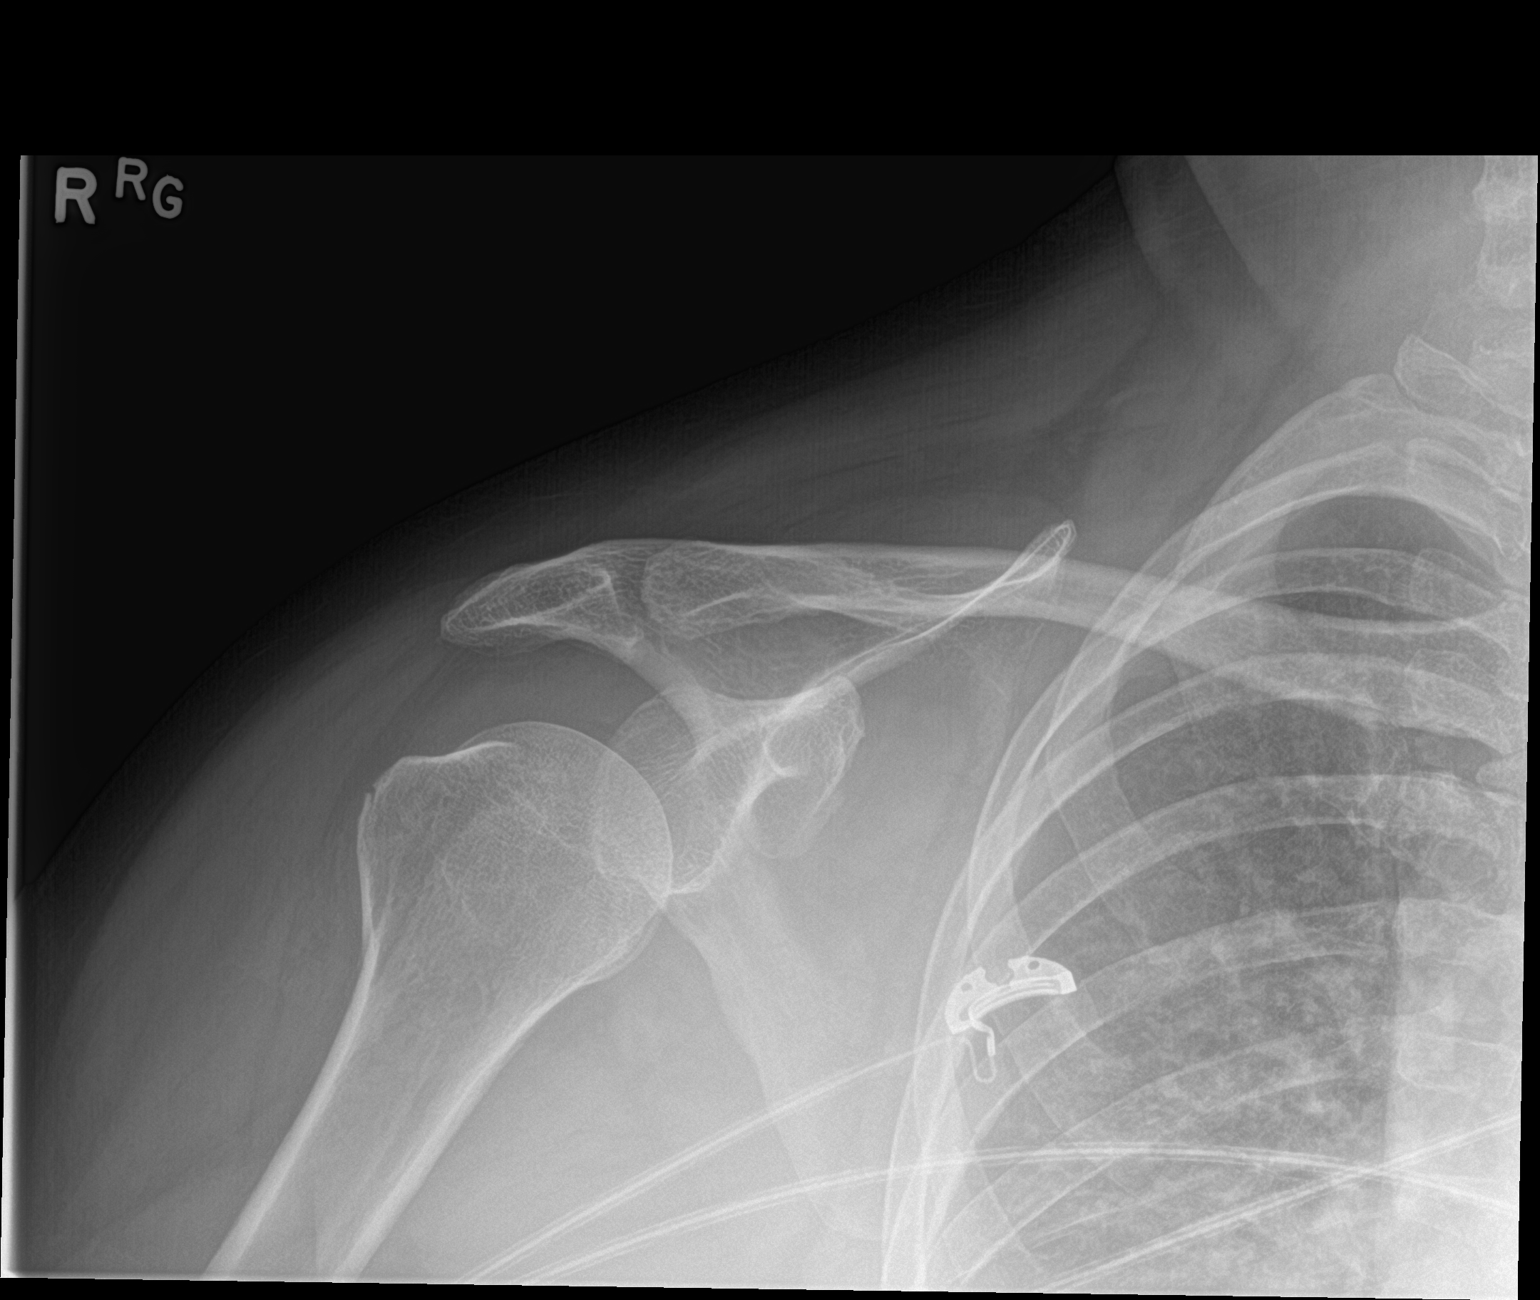

[shoulder y view]
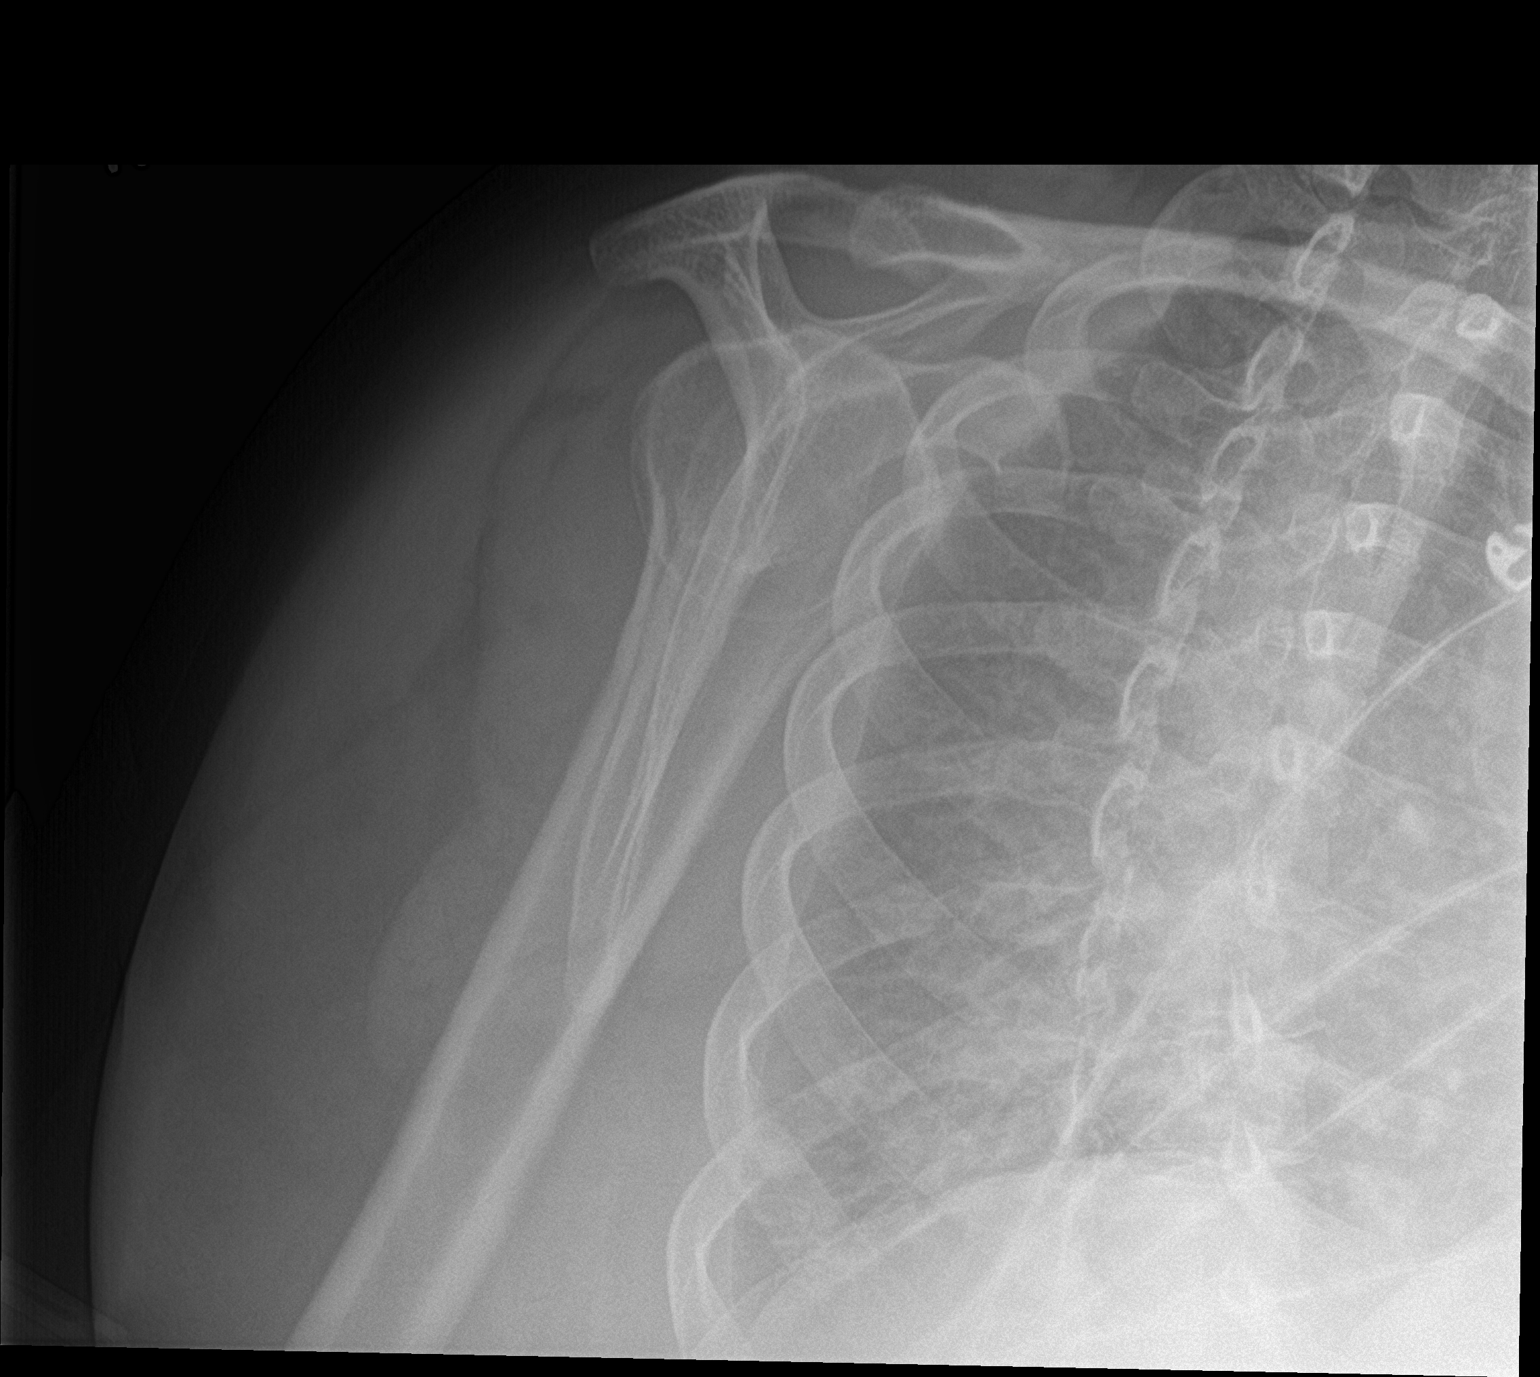

[2 of 2 positions shown; findings below may reference images not displayed]

FINDINGS: Normal alignment of the glenohumeral joint post reduction of
anterior shoulder dislocation. Subtle focal cortical regularity over
the greater tuberosity without evidence of fracture. Remainder the
exam is unchanged.
IMPRESSION: Normal alignment at the glenohumeral joint post reduction of
anterior shoulder dislocation. No fracture.

## 2023-06-29 ENCOUNTER — Ambulatory Visit: Payer: Medicaid Other | Admitting: Cardiology

## 2023-07-16 NOTE — Progress Notes (Deleted)
 Referring-Anna Collins NP Reason for referral-Palpitations  HPI: 53 yo female for evaluation of palpitations at request of Jannette Mend NP. Labs 2/25 showed Cr 0.65 and K 4.1.  Current Outpatient Medications  Medication Sig Dispense Refill   citalopram (CELEXA) 40 MG tablet   2   diazepam (VALIUM) 5 MG tablet Take 1 tablet (5 mg total) by mouth 2 (two) times daily. 10 tablet 0   HYDROcodone-acetaminophen (NORCO/VICODIN) 5-325 MG tablet Take 1 tablet by mouth every 8 (eight) hours as needed for moderate pain. 30 tablet 0   HYDROcodone-acetaminophen (NORCO/VICODIN) 5-325 MG tablet 1 PO BID PRN PAIN 35 tablet 0   lisinopril (PRINIVIL,ZESTRIL) 20 MG tablet Take 20 mg by mouth daily.     omeprazole-sodium bicarbonate (ZEGERID) 40-1100 MG per capsule Take 1 capsule by mouth daily before breakfast.     oxyCODONE-acetaminophen (PERCOCET/ROXICET) 5-325 MG tablet Take 2 tablets by mouth every 4 (four) hours as needed for severe pain. 15 tablet 0   promethazine (PHENERGAN) 25 MG tablet 1 PO Q 12 HRS PRN NAUSEA 30 tablet 0   No current facility-administered medications for this visit.    Allergies  Allergen Reactions   Ciprofloxacin     Upsets stomach   Codeine Nausea Only     Past Medical History:  Diagnosis Date   Crohn's disease (HCC)    Hypertension    IBS (irritable bowel syndrome)    IBS (irritable bowel syndrome)     Past Surgical History:  Procedure Laterality Date   CHOLECYSTECTOMY     mastoid surgery      Social History   Socioeconomic History   Marital status: Married    Spouse name: Not on file   Number of children: Not on file   Years of education: Not on file   Highest education level: Not on file  Occupational History   Not on file  Tobacco Use   Smoking status: Every Day   Smokeless tobacco: Current  Substance and Sexual Activity   Alcohol use: Yes    Comment: social   Drug use: No   Sexual activity: Yes    Birth control/protection: None  Other  Topics Concern   Not on file  Social History Narrative   Not on file   Social Drivers of Health   Financial Resource Strain: Low Risk  (09/06/2022)   Received from [facility]   Overall Financial Resource Strain (CARDIA)    Difficulty of Paying Living Expenses: Not very hard  Food Insecurity: Food Insecurity Present (09/06/2022)   Received from [facility]   Hunger Vital Sign    Worried About Running Out of Food in the Last Year: Sometimes true    Ran Out of Food in the Last Year: Sometimes true  Transportation Needs: Unmet Transportation Needs (09/06/2022)   Received from [facility] - Transportation    Lack of Transportation (Medical): Yes    Lack of Transportation (Non-Medical): Yes  Physical Activity: Not on file  Stress: No Stress Concern Present (07/30/2020)   Received from [facility], [facility]   [employer] of Occupational Health - Occupational Stress Questionnaire    Feeling of Stress : Not at all  Social Connections: Unknown (08/03/2021)   Received from [facility], [facility]   Social Network    Social Network: Not on file  Intimate Partner Violence: Unknown (07/08/2021)   Received from [facility], [facility]   HITS    Physically Hurt: Not on  file    Insult or Talk Down To: Not on file    Threaten Physical Harm: Not on file    Scream or Curse: Not on file    No family history on file.  ROS: no fevers or chills, productive cough, hemoptysis, dysphasia, odynophagia, melena, hematochezia, dysuria, hematuria, rash, seizure activity, orthopnea, PND, pedal edema, claudication. Remaining systems are negative.  Physical Exam:   Last menstrual period 05/18/2012.  General:  Well developed/well nourished in NAD Skin warm/dry Patient not depressed No peripheral clubbing Back-normal HEENT-normal/normal eyelids Neck supple/normal carotid upstroke bilaterally; no bruits; no JVD; no thyromegaly chest - CTA/ normal expansion CV -  RRR/normal S1 and S2; no murmurs, rubs or gallops;  PMI nondisplaced Abdomen -NT/ND, no HSM, no mass, + bowel sounds, no bruit 2+ femoral pulses, no bruits Ext-no edema, chords, 2+ DP Neuro-grossly nonfocal  ECG - personally reviewed  A/P  1 Palpitations-  Alexandria Angel, MD

## 2023-07-25 ENCOUNTER — Ambulatory Visit: Payer: Medicaid Other | Admitting: Cardiology

## 2023-12-20 ENCOUNTER — Encounter: Payer: Self-pay | Admitting: Cardiology

## 2023-12-20 NOTE — Progress Notes (Deleted)
 Lori Gerome Dickens, NP Reason for referral-palpitations  HPI: 53 year old female for evaluation of palpitations at request of Therisa Gerome Dickens, NP.  Current Outpatient Medications  Medication Sig Dispense Refill   citalopram (CELEXA) 40 MG tablet   2   diazepam  (VALIUM ) 5 MG tablet Take 1 tablet (5 mg total) by mouth 2 (two) times daily. 10 tablet 0   HYDROcodone -acetaminophen  (NORCO/VICODIN) 5-325 MG tablet Take 1 tablet by mouth every 8 (eight) hours as needed for moderate pain. 30 tablet 0   HYDROcodone -acetaminophen  (NORCO/VICODIN) 5-325 MG tablet 1 PO BID PRN PAIN 35 tablet 0   lisinopril (PRINIVIL,ZESTRIL) 20 MG tablet Take 20 mg by mouth daily.     omeprazole-sodium bicarbonate (ZEGERID) 40-1100 MG per capsule Take 1 capsule by mouth daily before breakfast.     oxyCODONE -acetaminophen  (PERCOCET/ROXICET) 5-325 MG tablet Take 2 tablets by mouth every 4 (four) hours as needed for severe pain. 15 tablet 0   promethazine  (PHENERGAN ) 25 MG tablet 1 PO Q 12 HRS PRN NAUSEA 30 tablet 0   No current facility-administered medications for this visit.    Allergies  Allergen Reactions   Ciprofloxacin     Upsets stomach   Codeine Nausea Only     Past Medical History:  Diagnosis Date   Crohn's disease (HCC)    Hypertension    IBS (irritable bowel syndrome)    IBS (irritable bowel syndrome)     Past Surgical History:  Procedure Laterality Date   CHOLECYSTECTOMY     mastoid surgery      Social History   Socioeconomic History   Marital status: Married    Spouse name: Not on file   Number of children: Not on file   Years of education: Not on file   Highest education level: Not on file  Occupational History   Not on file  Tobacco Use   Smoking status: Every Day   Smokeless tobacco: Current  Substance and Sexual Activity   Alcohol use: Yes    Comment: social   Drug use: No   Sexual activity: Yes    Birth control/protection: None  Other Topics Concern    Not on file  Social History Narrative   Not on file   Social Drivers of Health   Financial Resource Strain: Medium Risk (07/02/2023)   Received from [facility]   Overall Financial Resource Strain (CARDIA)    Difficulty of Paying Living Expenses: Somewhat hard  Food Insecurity: Food Insecurity Present (07/02/2023)   Received from [facility]   Hunger Vital Sign    Worried About Running Out of Food in the Last Year: Sometimes true    Ran Out of Food in the Last Year: Sometimes true  Transportation Needs: Unmet Transportation Needs (07/02/2023)   Received from [facility] - Transportation    Lack of Transportation (Medical): Yes    Lack of Transportation (Non-Medical): Yes  Physical Activity: Unknown (07/02/2023)   Received from [facility]   Exercise Vital Sign    Days of Exercise per Week: 0 days    Minutes of Exercise per Session: Not on file  Stress: Stress Concern Present (07/02/2023)   Received from [facility] of Occupational Health - Occupational Stress Questionnaire    Feeling of Stress : Rather much  Social Connections: Somewhat Isolated (07/02/2023)   Received from [facility]   Social Network    How would you rate your social network (family, work, friends)?: Restricted participation  with some degree of social isolation  Intimate Partner Violence: Not At Risk (07/02/2023)   Received from [facility]   HITS    Over the last 12 months how often did your partner physically hurt you?: Never    Over the last 12 months how often did your partner insult you or talk down to you?: Fairly often    Over the last 12 months how often did your partner threaten you with physical harm?: Rarely    Over the last 12 months how often did your partner scream or curse at you?: Sometimes    No family history on file.  ROS: no fevers or chills, productive cough, hemoptysis, dysphasia, odynophagia, melena, hematochezia, dysuria, hematuria, rash,  seizure activity, orthopnea, PND, pedal edema, claudication. Remaining systems are negative.  Physical Exam:   Last menstrual period 05/18/2012.  General:  Well developed/well nourished in NAD Skin warm/dry Patient not depressed No peripheral clubbing Back-normal HEENT-normal/normal eyelids Neck supple/normal carotid upstroke bilaterally; no bruits; no JVD; no thyromegaly chest - CTA/ normal expansion CV - RRR/normal S1 and S2; no murmurs, rubs or gallops;  PMI nondisplaced Abdomen -NT/ND, no HSM, no mass, + bowel sounds, no bruit 2+ femoral pulses, no bruits Ext-no edema, chords, 2+ DP Neuro-grossly nonfocal  ECG - personally reviewed  A/P  1 palpitations-  2 hypertension-  Redell Shallow, MD

## 2023-12-26 ENCOUNTER — Ambulatory Visit: Admitting: Cardiology
